# Patient Record
Sex: Male | Born: 1965 | Race: White | Hispanic: No | Marital: Married | Smoking: Never smoker
Health system: Southern US, Community
[De-identification: ages and names within clinical notes are randomized; demographics above are authoritative.]

## PROBLEM LIST (undated history)

## (undated) DIAGNOSIS — R0989 Other specified symptoms and signs involving the circulatory and respiratory systems: Secondary | ICD-10-CM

## (undated) DIAGNOSIS — T7840XA Allergy, unspecified, initial encounter: Secondary | ICD-10-CM

## (undated) DIAGNOSIS — M5412 Radiculopathy, cervical region: Secondary | ICD-10-CM

## (undated) DIAGNOSIS — K8689 Other specified diseases of pancreas: Secondary | ICD-10-CM

## (undated) DIAGNOSIS — M1990 Unspecified osteoarthritis, unspecified site: Secondary | ICD-10-CM

## (undated) DIAGNOSIS — I517 Cardiomegaly: Secondary | ICD-10-CM

## (undated) DIAGNOSIS — N529 Male erectile dysfunction, unspecified: Secondary | ICD-10-CM

## (undated) DIAGNOSIS — N183 Chronic kidney disease, stage 3 unspecified: Secondary | ICD-10-CM

## (undated) DIAGNOSIS — D751 Secondary polycythemia: Secondary | ICD-10-CM

## (undated) DIAGNOSIS — Z9189 Other specified personal risk factors, not elsewhere classified: Secondary | ICD-10-CM

## (undated) DIAGNOSIS — E559 Vitamin D deficiency, unspecified: Secondary | ICD-10-CM

## (undated) DIAGNOSIS — E785 Hyperlipidemia, unspecified: Secondary | ICD-10-CM

## (undated) DIAGNOSIS — I340 Nonrheumatic mitral (valve) insufficiency: Secondary | ICD-10-CM

## (undated) DIAGNOSIS — I10 Essential (primary) hypertension: Secondary | ICD-10-CM

## (undated) DIAGNOSIS — R7302 Impaired glucose tolerance (oral): Secondary | ICD-10-CM

## (undated) DIAGNOSIS — K760 Fatty (change of) liver, not elsewhere classified: Secondary | ICD-10-CM

## (undated) DIAGNOSIS — G43909 Migraine, unspecified, not intractable, without status migrainosus: Secondary | ICD-10-CM

## (undated) DIAGNOSIS — R161 Splenomegaly, not elsewhere classified: Secondary | ICD-10-CM

## (undated) DIAGNOSIS — K625 Hemorrhage of anus and rectum: Secondary | ICD-10-CM

## (undated) DIAGNOSIS — B019 Varicella without complication: Secondary | ICD-10-CM

## (undated) DIAGNOSIS — F32A Depression, unspecified: Secondary | ICD-10-CM

## (undated) DIAGNOSIS — F419 Anxiety disorder, unspecified: Secondary | ICD-10-CM

## (undated) DIAGNOSIS — R7303 Prediabetes: Secondary | ICD-10-CM

## (undated) HISTORY — DX: Hyperlipidemia, unspecified: E78.5

## (undated) HISTORY — DX: Secondary polycythemia: D75.1

## (undated) HISTORY — DX: Other specified symptoms and signs involving the circulatory and respiratory systems: R09.89

## (undated) HISTORY — DX: Other specified personal risk factors, not elsewhere classified: Z91.89

## (undated) HISTORY — DX: Hemorrhage of anus and rectum: K62.5

## (undated) HISTORY — DX: Migraine, unspecified, not intractable, without status migrainosus: G43.909

## (undated) HISTORY — DX: Allergy, unspecified, initial encounter: T78.40XA

## (undated) HISTORY — DX: Essential (primary) hypertension: I10

## (undated) HISTORY — PX: WISDOM TOOTH EXTRACTION: SHX21

## (undated) HISTORY — DX: Depression, unspecified: F32.A

## (undated) HISTORY — DX: Chronic kidney disease, stage 3 unspecified: N18.30

## (undated) HISTORY — DX: Vitamin D deficiency, unspecified: E55.9

## (undated) HISTORY — PX: COLONOSCOPY: SHX174

## (undated) HISTORY — DX: Varicella without complication: B01.9

## (undated) HISTORY — DX: Nonrheumatic mitral (valve) insufficiency: I34.0

## (undated) HISTORY — DX: Impaired glucose tolerance (oral): R73.02

## (undated) HISTORY — DX: Radiculopathy, cervical region: M54.12

## (undated) HISTORY — DX: Male erectile dysfunction, unspecified: N52.9

## (undated) HISTORY — DX: Other specified diseases of pancreas: K86.89

## (undated) HISTORY — DX: Splenomegaly, not elsewhere classified: R16.1

## (undated) HISTORY — DX: Fatty (change of) liver, not elsewhere classified: K76.0

## (undated) HISTORY — DX: Cardiomegaly: I51.7

---

## 2005-04-05 ENCOUNTER — Emergency Department (HOSPITAL_COMMUNITY): Admission: EM | Admit: 2005-04-05 | Discharge: 2005-04-05 | Payer: Self-pay | Admitting: Emergency Medicine

## 2007-07-30 ENCOUNTER — Emergency Department (HOSPITAL_COMMUNITY): Admission: EM | Admit: 2007-07-30 | Discharge: 2007-07-30 | Payer: Self-pay | Admitting: Emergency Medicine

## 2011-04-18 LAB — DIFFERENTIAL
Basophils Absolute: 0
Basophils Relative: 1
Eosinophils Relative: 3
Lymphs Abs: 2.6
Monocytes Relative: 8
Neutro Abs: 2.9
Neutrophils Relative %: 46

## 2011-04-18 LAB — COMPREHENSIVE METABOLIC PANEL
ALT: 33
AST: 24
BUN: 6
Chloride: 102
Creatinine, Ser: 0.98
GFR calc Af Amer: >60
GFR calc non Af Amer: >60
Glucose, Bld: 101 — ABNORMAL HIGH
Sodium: 138

## 2011-04-18 LAB — CBC
RBC: 5.19
RDW: 11.9

## 2011-04-18 LAB — POCT CARDIAC MARKERS: CKMB, poc: <1 — ABNORMAL LOW

## 2012-10-07 DIAGNOSIS — R55 Syncope and collapse: Secondary | ICD-10-CM | POA: Insufficient documentation

## 2012-10-07 DIAGNOSIS — R0789 Other chest pain: Secondary | ICD-10-CM | POA: Insufficient documentation

## 2012-10-07 DIAGNOSIS — R001 Bradycardia, unspecified: Secondary | ICD-10-CM | POA: Insufficient documentation

## 2012-10-07 DIAGNOSIS — E785 Hyperlipidemia, unspecified: Secondary | ICD-10-CM | POA: Insufficient documentation

## 2012-10-07 DIAGNOSIS — R079 Chest pain, unspecified: Secondary | ICD-10-CM

## 2012-10-07 HISTORY — DX: Chest pain, unspecified: R07.9

## 2014-03-18 LAB — TESTOSTERONE: Testosterone: 5.99

## 2014-03-18 LAB — POCT ERYTHROCYTE SEDIMENTATION RATE, NON-AUTOMATED: Sed Rate: 2

## 2016-11-15 LAB — IRON,TIBC AND FERRITIN PANEL
Ferritin: 68.3
TIBC: 615

## 2016-11-22 LAB — HM COLONOSCOPY

## 2016-12-23 LAB — HM HEPATITIS C SCREENING LAB: HM Hepatitis Screen: NEGATIVE

## 2020-01-27 DIAGNOSIS — I517 Cardiomegaly: Secondary | ICD-10-CM

## 2020-01-27 HISTORY — DX: Cardiomegaly: I51.7

## 2020-02-04 LAB — BASIC METABOLIC PANEL
BUN: 20 (ref 4–21)
CO2: 28 — AB (ref 13–22)
Chloride: 100 (ref 99–108)
Creatinine: 1.5 — AB (ref 0.6–1.3)
Glucose: 97
Potassium: 4.1 (ref 3.4–5.3)
Sodium: 137 (ref 137–147)

## 2020-02-04 LAB — COMPREHENSIVE METABOLIC PANEL
Albumin: 4.6 (ref 3.5–5.0)
Calcium: 10.5 (ref 8.7–10.7)
GFR calc Af Amer: 59
GFR calc non Af Amer: 49
Globulin: 2.3

## 2020-02-04 LAB — PULMONARY FUNCTION TEST

## 2020-02-04 LAB — LIPID PANEL
Cholesterol: 248 — AB (ref 0–200)
HDL: 38 (ref 35–70)
LDL Cholesterol: 185
Triglycerides: 126 (ref 40–160)

## 2020-02-04 LAB — CBC AND DIFFERENTIAL
HCT: 51 (ref 41–53)
Hemoglobin: 15.8 (ref 13.5–17.5)
Platelets: 277 (ref 150–399)
WBC: 6.9

## 2020-02-04 LAB — HEPATIC FUNCTION PANEL
ALT: 25 (ref 10–40)
AST: 21 (ref 14–40)
Alkaline Phosphatase: 57 (ref 25–125)
Bilirubin, Total: 0.8

## 2020-02-04 LAB — TSH: TSH: 1.24 (ref 0.41–5.90)

## 2020-02-04 LAB — HEMOGLOBIN A1C: Hemoglobin A1C: 5.2

## 2020-02-04 LAB — HM HIV SCREENING LAB: HM HIV Screening: NEGATIVE

## 2020-02-04 LAB — PSA: PSA: 1.01

## 2020-02-04 LAB — CBC: RBC: 5.12 — AB (ref 3.87–5.11)

## 2020-02-04 LAB — VITAMIN D 25 HYDROXY (VIT D DEFICIENCY, FRACTURES): Vit D, 25-Hydroxy: 34.4

## 2020-02-15 DIAGNOSIS — I517 Cardiomegaly: Secondary | ICD-10-CM | POA: Insufficient documentation

## 2020-02-15 HISTORY — DX: Cardiomegaly: I51.7

## 2020-09-19 ENCOUNTER — Other Ambulatory Visit: Payer: Self-pay

## 2020-10-04 ENCOUNTER — Other Ambulatory Visit: Payer: Self-pay

## 2020-10-16 ENCOUNTER — Encounter: Payer: Self-pay | Admitting: Family Medicine

## 2020-10-30 ENCOUNTER — Ambulatory Visit (INDEPENDENT_AMBULATORY_CARE_PROVIDER_SITE_OTHER): Payer: 59 | Admitting: Family Medicine

## 2020-10-30 ENCOUNTER — Encounter: Payer: Self-pay | Admitting: Family Medicine

## 2020-10-30 ENCOUNTER — Other Ambulatory Visit: Payer: Self-pay

## 2020-10-30 VITALS — BP 138/90 | HR 49 | Temp 97.1°F | Ht 70.25 in | Wt 223.0 lb

## 2020-10-30 DIAGNOSIS — N183 Chronic kidney disease, stage 3 unspecified: Secondary | ICD-10-CM | POA: Insufficient documentation

## 2020-10-30 DIAGNOSIS — Z0000 Encounter for general adult medical examination without abnormal findings: Secondary | ICD-10-CM

## 2020-10-30 DIAGNOSIS — I440 Atrioventricular block, first degree: Secondary | ICD-10-CM | POA: Insufficient documentation

## 2020-10-30 DIAGNOSIS — E559 Vitamin D deficiency, unspecified: Secondary | ICD-10-CM | POA: Diagnosis not present

## 2020-10-30 DIAGNOSIS — E782 Mixed hyperlipidemia: Secondary | ICD-10-CM

## 2020-10-30 DIAGNOSIS — N1831 Chronic kidney disease, stage 3a: Secondary | ICD-10-CM | POA: Diagnosis not present

## 2020-10-30 DIAGNOSIS — R55 Syncope and collapse: Secondary | ICD-10-CM

## 2020-10-30 DIAGNOSIS — Z789 Other specified health status: Secondary | ICD-10-CM | POA: Insufficient documentation

## 2020-10-30 DIAGNOSIS — R001 Bradycardia, unspecified: Secondary | ICD-10-CM

## 2020-10-30 DIAGNOSIS — I10 Essential (primary) hypertension: Secondary | ICD-10-CM | POA: Diagnosis not present

## 2020-10-30 MED ORDER — AMLODIPINE BESYLATE 2.5 MG PO TABS: 2.5000 mg | ORAL_TABLET | Freq: Every day | ORAL | 1 refills | Status: DC

## 2020-10-30 MED ORDER — EZETIMIBE 10 MG PO TABS: 10.0000 mg | ORAL_TABLET | Freq: Every day | ORAL | 3 refills | Status: DC

## 2020-10-30 NOTE — Patient Instructions (Addendum)
Nice to meet you today.  Next appt in 4 months - when BP stable on new med> we will see you ever 6 months.   We will collect fasting labs in  4 months.    Preventing High Cholesterol Cholesterol is a white, waxy substance similar to fat that the human body needs to help build cells. The liver makes all the cholesterol that a person's body needs. Having high cholesterol (hypercholesterolemia) increases your risk for heart disease and stroke. Extra or excess cholesterol comes from the food that you eat. High cholesterol can often be prevented with diet and lifestyle changes. If you already have high cholesterol, you can control it with diet, lifestyle changes, and medicines. How can high cholesterol affect me? If you have high cholesterol, fatty deposits (plaques) may build up on the walls of your blood vessels. The blood vessels that carry blood away from your heart are called arteries. Plaques make the arteries narrower and stiffer. This in turn can:  Restrict or block blood flow and cause blood clots to form.  Increase your risk for heart attack and stroke. What can increase my risk for high cholesterol? This condition is more likely to develop in people who:  Eat foods that are high in saturated fat or cholesterol. Saturated fat is mostly found in foods that come from animal sources.  Are overweight.  Are not getting enough exercise.  Have a family history of high cholesterol (familial hypercholesterolemia). What actions can I take to prevent this? Nutrition  Eat less saturated fat.  Avoid trans fats (partially hydrogenated oils). These are often found in margarine and in some baked goods, fried foods, and snacks bought in packages.  Avoid precooked or cured meat, such as bacon, sausages, or meat loaves.  Avoid foods and drinks that have added sugars.  Eat more fruits, vegetables, and whole grains.  Choose healthy sources of protein, such as fish, poultry, lean cuts of red  meat, beans, peas, lentils, and nuts.  Choose healthy sources of fat, such as: ? Nuts. ? Vegetable oils, especially olive oil. ? Fish that have healthy fats, such as omega-3 fatty acids. These fish include mackerel or salmon.   Lifestyle  Lose weight if you are overweight. Maintaining a healthy body mass index (BMI) can help prevent or control high cholesterol. It can also lower your risk for diabetes and high blood pressure. Ask your health care provider to help you with a diet and exercise plan to lose weight safely.  Do not use any products that contain nicotine or tobacco, such as cigarettes, e-cigarettes, and chewing tobacco. If you need help quitting, ask your health care provider. Alcohol use  Do not drink alcohol if: ? Your health care provider tells you not to drink. ? You are pregnant, may be pregnant, or are planning to become pregnant.  If you drink alcohol: ? Limit how much you use to:  0-1 drink a day for women.  0-2 drinks a day for men. ? Be aware of how much alcohol is in your drink. In the [place] one drink equals one 12 oz bottle of beer (355 mL), one 5 oz glass of wine (148 mL), or one 1 oz glass of hard liquor (44 mL). Activity  Get enough exercise. Do exercises as told by your health care provider.  Each week, do at least 150 minutes of exercise that takes a medium level of effort (moderate-intensity exercise). This kind of exercise: ? Makes your heart beat faster while allowing  you to still be able to talk. ? Can be done in short sessions several times a day or longer sessions a few times a week. For example, on 5 days each week, you could walk fast or ride your bike 3 times a day for 10 minutes each time.   Medicines  Your health care provider may recommend medicines to help lower cholesterol. This may be a medicine to lower the amount of cholesterol that your liver makes. You may need medicine if: ? Diet and lifestyle changes have not lowered your cholesterol  enough. ? You have high cholesterol and other risk factors for heart disease or stroke.  Take over-the-counter and prescription medicines only as told by your health care provider. General information  Manage your risk factors for high cholesterol. Talk with your health care provider about all your risk factors and how to lower your risk.  Manage other conditions that you have, such as diabetes or high blood pressure (hypertension).  Have blood tests to check your cholesterol levels at regular points in time as told by your health care provider.  Keep all follow-up visits as told by your health care provider. This is important. Where to find more information  American Heart Association: www.heart.org  National Heart, Lung, and Blood Institute: PopSteam.iswww.nhlbi.nih.gov Summary  High cholesterol increases your risk for heart disease and stroke. By keeping your cholesterol level low, you can reduce your risk for these conditions.  High cholesterol can often be prevented with diet and lifestyle changes.  Work with your health care provider to manage your risk factors, and have your blood tested regularly. This information is not intended to replace advice given to you by your health care provider. Make sure you discuss any questions you have with your health care provider. Document Revised: 04/27/2019 Document Reviewed: 04/27/2019 Elsevier Patient Education  2021 Elsevier Inc.    Please help us help you:  We are [facility]. Below you will find basic instructions that you may need to access in the future. Please help us help you by reading the instructions, which cover many of the frequent questions we experience.   Prescription refills and request:  -In order to allow more efficient response time, please call your pharmacy for all refills. They will forward the request electronically to us. This allows for the quickest possible  response. Request left on a nurse line can take longer to refill, since these are checked as time allows between office patients and other phone calls.  - refill request can take up to 3-5 working days to complete.  - If request is sent electronically and request is appropiate, it is usually completed in 1-2 business days.  - all patients will need to be seen routinely for all chronic medical conditions requiring prescription medications (see follow-up below). If you are overdue for follow up on your condition, you will be asked to make an appointment and we will call in enough medication to cover you until your appointment (up to 30 days).  - all controlled substances will require a face to face visit to request/refill.  - if you desire your prescriptions to go through a new pharmacy, and have an active script at original pharmacy, you will need to call your pharmacy and have scripts transferred to new pharmacy. This is completed between the pharmacy locations and not by your provider.    Results: Our office handles many outgoing and incoming calls daily.  If we have not contacted you within 1 week about your results, please check your mychart to see if there is a message first and if not, then contact our office.  In helping with this matter, you help decrease call volume, and therefore allow [place] to be able to respond to patients needs more efficiently.  We will always attempt to call you with results,  normal or abnormal. However, if we are unable to reach you we will send a message in your my chart with results.   Acute office visits (sick visit):  An acute visit is intended for a new problem and are scheduled in shorter time slots to allow schedule openings for patients with new problems. This is the appropriate visit to discuss a new problem. Problems will not be addressed by phone call or Echart message. Appointment is needed if requesting treatment. In order to provide you with excellent quality  medical care with proper time for you to explain your problem, have an exam and receive treatment with instructions, these appointments should be limited to one new problem per visit. If you experience a new problem, in which you desire to be addressed, please make an acute office visit, we save openings on the schedule to accommodate you. Please do not save your new problem for any other type of visit, let us take care of it properly and quickly for you.   Follow up visits:  Depending on your condition(s) your provider will need to see you routinely in order to provide you with quality care and prescribe medication(s). Most chronic conditions (Example: hypertension, Diabetes, depression/anxiety... etc), require visits a couple times a year. Your provider will instruct you on proper follow up for your personal medical conditions and history. Please make certain to make follow up appointments for your condition as instructed. Failing to do so could result in lapse in your medication treatment/refills. If you request a refill, and are overdue to be seen on a condition, we will always provide you with a 30 day script (once) to allow you time to schedule.    Medicare wellness (well visit): - we have a wonderful Nurse Selena Batten), that will meet with you and provide you will yearly medicare wellness visits. These visits should occur yearly (can not be scheduled less than 1 calendar year apart) and cover preventive health, immunizations, advance directives and screenings you are entitled to yearly through your medicare benefits. Do not miss out on your entitled benefits, this is when medicare will pay for these benefits to be ordered for you.  These are strongly encouraged by your provider and is the appropriate type of visit to make certain you are up to date with all preventive health benefits. If you have not had your medicare wellness exam in the last 12 months, please make certain to schedule one by calling the  office and schedule your medicare wellness with Selena Batten as soon as possible.   Yearly physical (well visit):  - Adults are recommended to be seen yearly for physicals. Check with your insurance and date of your last physical, most insurances require one calendar year between physicals. Physicals include all preventive health topics, screenings, medical exam and labs that are appropriate for gender/age and history. You may have fasting labs needed at this visit. This is a well visit (not a sick visit), new problems should not be covered during this visit (see acute visit).  - Pediatric patients are seen more frequently when they are younger. Your provider will  advise you on well child visit timing that is appropriate for your their age. - This is not a medicare wellness visit. Medicare wellness exams do not have an exam portion to the visit. Some medicare companies allow for a physical, some do not allow a yearly physical. If your medicare allows a yearly physical you can schedule the medicare wellness with our nurse Selena Batten and have your physical with your provider after, on the same day. Please check with insurance for your full benefits.   Late Policy/No Shows:  - all new patients should arrive 15-30 minutes earlier than appointment to allow [place] time  to  obtain all personal demographics,  insurance information and for you to complete office paperwork. - All established patients should arrive 10-15 minutes earlier than appointment time to update all information and be checked in .  - In our best efforts to run on time, if you are late for your appointment you will be asked to either reschedule or if able, we will work you back into the schedule. There will be a wait time to work you back in the schedule,  depending on availability.  - If you are unable to make it to your appointment as scheduled, please call 24 hours ahead of time to allow [place] to fill the time slot with someone else who needs to be seen. If you  do not cancel your appointment ahead of time, you may be charged a no show fee.

## 2020-10-30 NOTE — Progress Notes (Signed)
Patient ID: Samuel Blackburn, male  DOB: [birth year], 55 y.o.   MRN: [id number] Patient Care Team    Relationship Specialty Notifications Start End  Natalia Leatherwood, DO PCP - General Family Medicine  10/30/20     Chief Complaint  Patient presents with  . Establish Care    [facility];     Subjective:  Samuel Blackburn is a 55 y.o.  male present for new patient establishment. All past medical history, surgical history, allergies, family history, immunizations, medications and social history were updated in the electronic medical record today. All recent labs, ED visits and hospitalizations within the last year were reviewed.   Hypertension/hyperlipidemia/overweight:Pt reports compliance with HCTZ 25 mg daily. Blood pressures ranges at home have been above goal. Patient denies chest pain, shortness of breath or lower extremity edema. Pt takes a daily baby ASA. Pt was prescribed statin, Crestor 10 mg nightly, however he states that the medicine caused memory issues. Exercise: Has started to get back in the gym. RF: HTN, HLD,Body mass index is 31.77 kg/m.,  Family history HD and stroke  CKD 3: Patient has mild decreased kidney function by last labs in July with a creatinine of 1.5 and a GFR of 49.  Vitamin D deficiency: History of vitamin D deficiency taking supplement.  Vasovagal/presyncope/bradycardia: Patient reports he has a past medical history of fainting spells.  He states he passes out typically with procedures or blood draw.  He reports he had a full work-up in the late 90s for this condition and no cause was found.  He reports he has followed with cardiology and wore a monitor for 30 days, and nothing was reported as cause.  He has a few other family members that also has vasovagal episodes frequently.  He has chronic bradycardia.  Depression screen [facility] 2/9 10/30/2020  Decreased Interest 0  Down, Depressed, Hopeless 0  PHQ - 2 Score 0   No flowsheet data found.      Fall Risk   10/30/2020  Falls in the past year? 0  Number falls in past yr: 0  Injury with Fall? 0  Follow up Falls evaluation completed     Immunization History  Administered Date(s) Administered  . Influenza-Unspecified 05/27/2020  . PFIZER(Purple Top)SARS-COV-2 Vaccination 10/18/2019, 11/15/2019, 06/29/2020    No exam data present  Past Medical History:  Diagnosis Date  . Allergies   . Bruit   . Chest pain 10/07/2012  . Chicken pox   . Depression   . Hepatic steatosis   . History of fainting spells of unknown cause   . Hyperlipemia   . Hypertension   . IGT (impaired glucose tolerance)   . LAE (left atrial enlargement)   . LVH (left ventricular hypertrophy)   . Migraines   . Mitral regurgitation   . Polycythemia   . Stage 3 chronic kidney disease (HCC)   . Vitamin D deficiency    Allergies  Allergen Reactions  . Dust Mite Extract     Pollen, dust, and wire   Past Surgical History:  Procedure Laterality Date  . COLONOSCOPY  20118   Family History  Problem Relation Age of Onset  . Stroke Mother   . Early death Mother   . Depression Mother   . Anxiety disorder Mother   . Diabetes Father   . Heart disease Father   . Hyperlipidemia Father   . Colon cancer Father   . Depression Maternal Grandmother   . Miscarriages / [place]  Maternal Grandmother   . Hyperlipidemia Maternal Grandfather   . Hypertension Maternal Grandfather   . Diabetes Paternal Grandmother   . COPD Paternal Grandfather   . Hearing loss Paternal Grandfather   . Heart disease Paternal Grandfather   . Healthy Brother   . Healthy Daughter   . Healthy Son    Social History   Social History Narrative   Marital status/children/pets: Married   Education/employment: Oncologist, works as an Manufacturing systems engineer.   Safety:      -smoke alarm in the home:Yes     - wears seatbelt: Yes    Allergies as of 10/30/2020      Reactions   Dust Mite Extract    Pollen, dust, and wire      Medication  List       Accurate as of October 30, 2020 12:42 PM. If you have any questions, ask your nurse or doctor.        STOP taking these medications   hydrochlorothiazide 25 MG tablet Commonly known as: HYDRODIURIL Stopped by: Felix Pacini, DO   rosuvastatin 10 MG tablet Commonly known as: CRESTOR Stopped by: Felix Pacini, DO     TAKE these medications   amLODipine 2.5 MG tablet Commonly known as: NORVASC Take 1 tablet (2.5 mg total) by mouth daily. Started by: Felix Pacini, DO   aspirin EC 81 MG tablet Take 1 tablet (81 mg total) by mouth daily. Swallow whole.   Cholecalciferol 25 MCG (1000 UT) capsule 10 capsules every 3 days   Co Q-10 200 MG Caps Take by mouth.   ezetimibe 10 MG tablet Commonly known as: Zetia Take 1 tablet (10 mg total) by mouth daily. Started by: Felix Pacini, DO   Fish Oil 645 MG Caps Take by mouth.   Flaxseed Oil 1400 MG Caps Take by mouth.   Grape Seed Extract 100 MG Caps Take 5 capsules (500 mg total) by mouth.   Magnesium 200 MG Tabs Take 2 tablets (400 mg total) by mouth.   melatonin 5 MG Tabs Take 3 tablets (15 mg total) by mouth.   multivitamin tablet Take 1 tablet by mouth daily.   Neoke BCAA4 Powd Take 1 g by mouth.       All past medical history, surgical history, allergies, family history, immunizations andmedications were updated in the EMR today and reviewed under the history and medication portions of their EMR.    No results found for this or any previous visit (from the past 2160 hour(s)).   ROS: 14 pt review of systems performed and negative (unless mentioned in an HPI)  Objective: BP 138/90 (BP Location: Right Arm, Patient Position: Sitting, Cuff Size: Large)   Pulse (!) 49   Temp (!) 97.1 F (36.2 C) (Temporal)   Ht 5' 10.25" (1.784 m)   Wt 223 lb (101.2 kg)   SpO2 98%   BMI 31.77 kg/m  Gen: Afebrile. No acute distress. Nontoxic in appearance, well-developed, well-nourished,  Pleasant male.  HENT: AT. [place].   Eyes:Pupils Equal Round Reactive to light, Extraocular movements intact,  Conjunctiva without redness, discharge or icterus. Neck/lymp/endocrine: Supple,no lymphadenopathy, no thyromegaly CV: RRR no murmur, no edema, +2/4 P posterior tibialis pulses.  Chest: CTAB, no wheeze, rhonchi or crackles. normal Respiratory effort. good Air movement. Skin: no rashes, purpura or petechiae. Warm and well-perfused. Skin intact. Neuro/Msk: Normal gait. PERLA. EOMi. Alert. Oriented x3.   Psych: Normal affect, dress and demeanor. Normal speech. Normal thought content and judgment.  Assessment/plan: Samuel Blackburn  Samuel Blackburn is a 55 y.o. male present for est care.  Encounter for medical examination to establish care Hypertension/mixed hyperlipidemia/obesity/statin intolerance DC HCTZ Start amlodipine 2.5 mg daily> we will taper to 5 mg next visit if needed. CBC, CMP, TSH and lipids collected next visit. Zetia 10 mg nightly (patient to start Zetia in 2 weeks after starting amlodipine) Intolerant to statin-Crestor Low-sodium diet, heart healthy diet Routine exercise Follow-up in 4 months  Vitamin D deficiency Stable Continue vitamin D OTC supplement 1000 units daily Monitor vitamin D yearly   Stage 3a chronic kidney disease (HCC) CMP will be collected next visit. Avoid nephrotoxic agents when feasible. Hydrate We will monitor every 6-12 months  Bradycardia/vasovagal Patient has a history of bradycardia and possible first-degree AV block by history.  He reports this has been chronic his entire life.  He states he has had cardiology evaluation and even a hospital stay that did not resolve with cause of his symptoms.   Return in about 4 months (around 03/01/2021) for [facility] (30 min).  No orders of the defined types were placed in this encounter.  Meds ordered this encounter  Medications  . ezetimibe (ZETIA) 10 MG tablet    Sig: Take 1 tablet (10 mg total) by mouth daily.    Dispense:  90 tablet    Refill:   3  . amLODipine (NORVASC) 2.5 MG tablet    Sig: Take 1 tablet (2.5 mg total) by mouth daily.    Dispense:  90 tablet    Refill:  1   Referral Orders  No referral(s) requested today    > 45 Minutes was dedicated to this patient's encounter to include pre-visit review of chart, face-to-face time with patient and post-visit work- which include documentation and prescribing medications and/or ordering test when necessary.    Note is dictated utilizing voice recognition software. Although note has been proof read prior to signing, occasional typographical errors still can be missed. If any questions arise, please do not hesitate to call for verification.  Electronically signed by: Felix Pacini, DO [facility] [place]

## 2020-12-08 ENCOUNTER — Other Ambulatory Visit: Payer: Self-pay

## 2020-12-08 ENCOUNTER — Encounter: Payer: Self-pay | Admitting: Family Medicine

## 2020-12-08 ENCOUNTER — Ambulatory Visit (INDEPENDENT_AMBULATORY_CARE_PROVIDER_SITE_OTHER): Payer: 59 | Admitting: Family Medicine

## 2020-12-08 VITALS — BP 136/90 | HR 54 | Temp 97.5°F | Ht 70.0 in | Wt 228.0 lb

## 2020-12-08 DIAGNOSIS — I440 Atrioventricular block, first degree: Secondary | ICD-10-CM | POA: Diagnosis not present

## 2020-12-08 DIAGNOSIS — Z8249 Family history of ischemic heart disease and other diseases of the circulatory system: Secondary | ICD-10-CM

## 2020-12-08 DIAGNOSIS — I10 Essential (primary) hypertension: Secondary | ICD-10-CM

## 2020-12-08 DIAGNOSIS — R0789 Other chest pain: Secondary | ICD-10-CM | POA: Diagnosis not present

## 2020-12-08 DIAGNOSIS — R0600 Dyspnea, unspecified: Secondary | ICD-10-CM

## 2020-12-08 DIAGNOSIS — E782 Mixed hyperlipidemia: Secondary | ICD-10-CM | POA: Diagnosis not present

## 2020-12-08 LAB — COMPREHENSIVE METABOLIC PANEL
ALT: 33 U/L (ref 0–53)
AST: 21 U/L (ref 0–37)
Albumin: 4.7 g/dL (ref 3.5–5.2)
Alkaline Phosphatase: 68 U/L (ref 39–117)
BUN: 19 mg/dL (ref 6–23)
CO2: 30 mEq/L (ref 19–32)
Calcium: 9.7 mg/dL (ref 8.4–10.5)
Chloride: 102 mEq/L (ref 96–112)
Creatinine, Ser: 1.26 mg/dL (ref 0.40–1.50)
GFR: 64.79 mL/min (ref >60.00)
Glucose, Bld: 104 mg/dL — ABNORMAL HIGH (ref 70–99)
Potassium: 4.6 mEq/L (ref 3.5–5.1)
Sodium: 139 mEq/L (ref 135–145)
Total Bilirubin: 0.8 mg/dL (ref 0.2–1.2)
Total Protein: 7.1 g/dL (ref 6.0–8.3)

## 2020-12-08 LAB — CBC WITH DIFFERENTIAL/PLATELET
Basophils Absolute: 0 10*3/uL (ref 0.0–0.1)
Basophils Relative: 0.7 % (ref 0.0–3.0)
Eosinophils Absolute: 0.3 10*3/uL (ref 0.0–0.7)
Eosinophils Relative: 4.9 % (ref 0.0–5.0)
HCT: 48.1 % (ref 39.0–52.0)
Hemoglobin: 16.1 g/dL (ref 13.0–17.0)
Lymphocytes Relative: 43.9 % (ref 12.0–46.0)
Lymphs Abs: 2.3 10*3/uL (ref 0.7–4.0)
MCHC: 33.5 g/dL (ref 30.0–36.0)
MCV: 92.1 fl (ref 78.0–100.0)
Monocytes Absolute: 0.4 10*3/uL (ref 0.1–1.0)
Monocytes Relative: 7.3 % (ref 3.0–12.0)
Neutro Abs: 2.2 10*3/uL (ref 1.4–7.7)
Neutrophils Relative %: 43.2 % (ref 43.0–77.0)
Platelets: 233 10*3/uL (ref 150.0–400.0)
RBC: 5.22 Mil/uL (ref 4.22–5.81)
RDW: 13 % (ref 11.5–15.5)
WBC: 5.2 10*3/uL (ref 4.0–10.5)

## 2020-12-08 LAB — TSH: TSH: 1.97 u[IU]/mL (ref 0.35–4.50)

## 2020-12-08 MED ORDER — LISINOPRIL 2.5 MG PO TABS: 2.5000 mg | ORAL_TABLET | Freq: Every day | ORAL | 5 refills | Status: DC

## 2020-12-08 NOTE — Progress Notes (Signed)
This visit occurred during the SARS-CoV-2 public health emergency.  Safety protocols were in place, including screening questions prior to the visit, additional usage of staff PPE, and extensive cleaning of exam room while observing appropriate contact time as indicated for disinfecting solutions.    Samuel Blackburn , 01-May-1966, 55 y.o., male MRN: [id number] Patient Care Team    Relationship Specialty Notifications Start End  Ma Hillock, DO PCP - General Family Medicine  10/30/20     Chief Complaint  Patient presents with  . Fatigue    Pt was seen at [facility] for fatigue, Chest tightness, and SOB; pt states sx are now resolved      Subjective: Pt presents for an OV for Urgent care follow up for chest pain.  He has a significant medical history  Of htn/hld and is not taking his medications of amlodipine, hctz or zetia. He has fhx of MI/stroke with sudden death. He was seen in the urgent care for fatigue, chest tightness and shortness of breath.  The completed a COVID test which was negative and an EKG which was read as normal. Reports his symptoms are mostly resolved now.  He does notice he still getting "breathy "with physical activity such as walking the dog and a little winded even when talking.  He had an episode a few weeks ago when he was at the gym that he became extremely fatigued after working out.  He had needed to take a nap for about 4 hours.  Since that time he has continued to have symptoms up until most recently when he was seen at the urgent care.  His blood pressure has continued to be mildly above goal. He has had a heart cath in 2006.  CT stress echo in 2014 and 2017. He denies any fever, cough, no recent illness, blood loss.  He reports he tolerates p.o. and is drinking plenty of water daily.  Depression screen [facility] 2/9 10/30/2020  Decreased Interest 0  Down, Depressed, Hopeless 0  PHQ - 2 Score 0    Allergies  Allergen Reactions  . Dust Mite Extract     Pollen,  dust, and wire   Social History   Social History Narrative   Marital status/children/pets: Married   Education/employment: Dietitian, works as an Pensions consultant.   Safety:      -smoke alarm in the home:Yes     - wears seatbelt: Yes   Past Medical History:  Diagnosis Date  . Allergies   . Bruit   . Cervical radiculopathy   . Chest pain 10/07/2012  . Chicken pox   . Depression   . Erectile dysfunction   . Fatty pancreas   . Hepatic steatosis   . History of fainting spells of unknown cause   . Hyperlipemia   . Hypertension   . IGT (impaired glucose tolerance)   . LAE (left atrial enlargement) 01/2020   "Markedly dilated "  . LVH (left ventricular hypertrophy) 02/15/2020   Mild concentric LVH  . Migraines   . Mitral regurgitation   . Polycythemia    Had hematology work-up and condition is benign  . Rectal bleeding   . Splenomegaly    "Mild "  . Stage 3 chronic kidney disease ([place])   . Vitamin D deficiency    Past Surgical History:  Procedure Laterality Date  . COLONOSCOPY  20118   Family History  Problem Relation Age of Onset  . Stroke Mother   . Early  death Mother   . Depression Mother   . Anxiety disorder Mother   . Diabetes Father   . Heart disease Father   . Hyperlipidemia Father   . Colon cancer Father   . Depression Maternal Grandmother   . Miscarriages / Stillbirths Maternal Grandmother   . Hyperlipidemia Maternal Grandfather   . Hypertension Maternal Grandfather   . Diabetes Paternal Grandmother   . COPD Paternal Grandfather   . Hearing loss Paternal Grandfather   . Heart disease Paternal Grandfather   . Healthy Brother   . Healthy Daughter   . Healthy Son    Allergies as of 12/08/2020      Reactions   Dust Mite Extract    Pollen, dust, and wire      Medication List       Accurate as of Dec 08, 2020  9:40 AM. If you have any questions, ask your nurse or doctor.        STOP taking these medications   amLODipine 2.5 MG  tablet Commonly known as: NORVASC Stopped by: Howard Pouch, DO   ezetimibe 10 MG tablet Commonly known as: Zetia Stopped by: Howard Pouch, DO   Flaxseed Oil 1400 MG Caps Stopped by: Howard Pouch, DO   Magnesium 200 MG Tabs Stopped by: Howard Pouch, DO   melatonin 5 MG Tabs Stopped by: Howard Pouch, DO     TAKE these medications   aspirin EC 81 MG tablet Take 162 mg by mouth daily. Swallow whole.   Cholecalciferol 25 MCG (1000 UT) capsule 10 capsules every 3 days   Co Q-10 200 MG Caps Take by mouth.   Fish Oil 645 MG Caps Take by mouth.   Grape Seed Extract 100 MG Caps Take 5 capsules (500 mg total) by mouth.   hydrochlorothiazide 25 MG tablet Commonly known as: HYDRODIURIL Take 1 tablet by mouth daily.   lisinopril 2.5 MG tablet Commonly known as: Zestril Take 1 tablet (2.5 mg total) by mouth daily. Started by: Howard Pouch, DO   multivitamin tablet Take 1 tablet by mouth daily.   Neoke BCAA4 Powd Take 1 g by mouth.       All past medical history, surgical history, allergies, family history, immunizations andmedications were updated in the EMR today and reviewed under the history and medication portions of their EMR.     ROS: Negative, with the exception of above mentioned in HPI   Objective:  BP 136/90   Pulse (!) 54   Temp (!) 97.5 F (36.4 C) (Oral)   Ht _0  (1.778 m)   Wt 228 lb (103.4 kg)   SpO2 98%   BMI 32.71 kg/m  Body mass index is 32.71 kg/m. Gen: Afebrile. No acute distress. Nontoxic in appearance, well developed, well nourished.  HENT: AT. [place]. MMM, no oral lesions.  No cough.  No hoarseness Eyes:Pupils Equal Round Reactive to light, Extraocular movements intact,  Conjunctiva without redness, discharge or icterus. Neck/lymp/endocrine: Supple, no lymphadenopathy CV: RRR no murmur, no edema Chest: CTAB, no wheeze or crackles. Good air movement, normal resp effort.  Skin: No rashes, purpura or petechiae.  Neuro:  Normal gait.  PERLA. EOMi. Alert. Oriented x3  Psych: Normal affect, dress and demeanor. Normal speech. Normal thought content and judgment.  No exam data present No results found. No results found for this or any previous visit (from the past 24 hour(s)).  Assessment/Plan: KENYA SHIRAISHI is a 55 y.o. male present for OV for  Hypertension/mixed hyperlipidemia/first-degree AV  block/chest discomfort/dyspnea/family history of heart disease Uncertain etiology of his symptoms.  He has had extensive work-up in the past from a cardiological standpoint.  He does have a family history of heart disease.  Uncertain if his symptoms were related to his heart versus lungs.  He could have had a mild upper respiratory illness at the time. Labs today to rule out electrolyte disturbance, iron deficiency, thyroid disorder, anemia as potential causes. Patient would like to proceed with CT cardiac scoring-self-pay.  If CT cardiac score is abnormal, would then recommend referral to cardiology for further evaluation. - Comp Met (CMET) - Iron, TIBC and Ferritin Panel - TSH - CBC w/Diff - CT CARDIAC SCORING (SELF PAY ONLY); Future In the meantime, better blood pressure control is warranted. Start lisinopril 2.5 mg daily. DC HCTZ.  DC amlodipine. Consider chest x-ray if labs are normal and symptoms continue.   Reviewed expectations re: course of current medical issues.  Discussed self-management of symptoms.  Outlined signs and symptoms indicating need for more acute intervention.  Patient verbalized understanding and all questions were answered.  Patient received an After-Visit Summary.    Orders Placed This Encounter  Procedures  . Comp Met (CMET)  . Iron, TIBC and Ferritin Panel  . TSH  . CBC w/Diff   Meds ordered this encounter  Medications  . lisinopril (ZESTRIL) 2.5 MG tablet    Sig: Take 1 tablet (2.5 mg total) by mouth daily.    Dispense:  30 tablet    Refill:  5   Referral Orders  No referral(s)  requested today     Note is dictated utilizing voice recognition software. Although note has been proof read prior to signing, occasional typographical errors still can be missed. If any questions arise, please do not hesitate to call for verification.   electronically signed by:  Howard Pouch, DO  [place]

## 2020-12-08 NOTE — Patient Instructions (Addendum)
Start lisinopril 2.5 mg daily.    Great to see you today.   refilled the medication(s) we provide. If labs were collected, we will inform you of lab results once received either by echart message or telephone call.   - echart message- for normal results that have been seen by the patient already.   - telephone call: abnormal results or if patient has not viewed results in their echart.

## 2020-12-09 LAB — IRON,TIBC AND FERRITIN PANEL
%SAT: 28 % (calc) (ref 20–48)
Ferritin: 57 ng/mL (ref 38–380)
Iron: 109 ug/dL (ref 50–180)
TIBC: 394 mcg/dL (calc) (ref 250–425)

## 2020-12-11 DIAGNOSIS — Z8249 Family history of ischemic heart disease and other diseases of the circulatory system: Secondary | ICD-10-CM | POA: Insufficient documentation

## 2020-12-15 ENCOUNTER — Ambulatory Visit (HOSPITAL_BASED_OUTPATIENT_CLINIC_OR_DEPARTMENT_OTHER)
Admission: RE | Admit: 2020-12-15 | Discharge: 2020-12-15 | Disposition: A | Discharge status: Home / Self Care | Payer: 59 | Source: Ambulatory Visit | Attending: Family Medicine | Admitting: Family Medicine

## 2020-12-15 ENCOUNTER — Telehealth: Payer: Self-pay | Admitting: Family Medicine

## 2020-12-15 ENCOUNTER — Other Ambulatory Visit: Payer: Self-pay

## 2020-12-15 DIAGNOSIS — I440 Atrioventricular block, first degree: Secondary | ICD-10-CM | POA: Insufficient documentation

## 2020-12-15 DIAGNOSIS — R0789 Other chest pain: Secondary | ICD-10-CM | POA: Insufficient documentation

## 2020-12-15 DIAGNOSIS — E782 Mixed hyperlipidemia: Secondary | ICD-10-CM | POA: Insufficient documentation

## 2020-12-15 DIAGNOSIS — R0600 Dyspnea, unspecified: Secondary | ICD-10-CM | POA: Insufficient documentation

## 2020-12-15 DIAGNOSIS — R931 Abnormal findings on diagnostic imaging of heart and coronary circulation: Secondary | ICD-10-CM

## 2020-12-15 DIAGNOSIS — Z8249 Family history of ischemic heart disease and other diseases of the circulatory system: Secondary | ICD-10-CM | POA: Insufficient documentation

## 2020-12-15 DIAGNOSIS — I10 Essential (primary) hypertension: Secondary | ICD-10-CM | POA: Insufficient documentation

## 2020-12-15 NOTE — Telephone Encounter (Signed)
Please inform patient the following information: His cardiac CT result was elevated, but only in 1 artery surrounding the heart called the circumflex, which branches off the left coronary artery.  This study does show changes beginning in  at least 1 of the 4 main arteries of the heart.  Given his age, blood pressure mildly increasing, elevated cholesterol and the beginning of changes of the arteries of the heart he should be on a cholesterol medication with an LDL goal <100.   Since he was feeling winded with exercise, I do feel a cardiology referral is warranted with the above findings to be thorough.   He also reported memory issues to his statin use, Crestor, in the past.  If he would like to go ahead and try to start a different statin medication I can call that in for him or we can wait until he meets with the cardiologist to discuss. Is advise of his decision

## 2020-12-18 MED ORDER — ATORVASTATIN CALCIUM 40 MG PO TABS: 40.0000 mg | ORAL_TABLET | Freq: Every day | ORAL | 3 refills | Status: DC

## 2020-12-18 NOTE — Telephone Encounter (Signed)
Spoke with pt regarding labs and instructions. Pt agrees with referral and starting medication.

## 2020-12-18 NOTE — Telephone Encounter (Signed)
Lipitor prescribed for patient. Cardiology referral has been placed Follow-up in 3 months with this provider for recheck hyperlipidemia.  Please come fasting to that appointment we will be collecting labs.  [place]

## 2020-12-18 NOTE — Addendum Note (Signed)
Addended by: Felix Pacini A on: 12/18/2020 04:15 PM   Modules accepted: Orders

## 2020-12-19 NOTE — Telephone Encounter (Signed)
Spoke with pt regarding labs and instructions.   

## 2021-01-11 ENCOUNTER — Ambulatory Visit (INDEPENDENT_AMBULATORY_CARE_PROVIDER_SITE_OTHER): Payer: 59 | Admitting: Family Medicine

## 2021-01-11 ENCOUNTER — Other Ambulatory Visit: Payer: Self-pay

## 2021-01-11 ENCOUNTER — Encounter: Payer: Self-pay | Admitting: Family Medicine

## 2021-01-11 VITALS — BP 124/82 | HR 56 | Temp 97.7°F | Ht 70.0 in | Wt 229.0 lb

## 2021-01-11 DIAGNOSIS — E782 Mixed hyperlipidemia: Secondary | ICD-10-CM | POA: Diagnosis not present

## 2021-01-11 DIAGNOSIS — I440 Atrioventricular block, first degree: Secondary | ICD-10-CM

## 2021-01-11 DIAGNOSIS — I10 Essential (primary) hypertension: Secondary | ICD-10-CM

## 2021-01-11 DIAGNOSIS — Z789 Other specified health status: Secondary | ICD-10-CM | POA: Diagnosis not present

## 2021-01-11 DIAGNOSIS — Z8249 Family history of ischemic heart disease and other diseases of the circulatory system: Secondary | ICD-10-CM

## 2021-01-11 DIAGNOSIS — I517 Cardiomegaly: Secondary | ICD-10-CM

## 2021-01-11 NOTE — Progress Notes (Signed)
This visit occurred during the SARS-CoV-2 public health emergency.  Safety protocols were in place, including screening questions prior to the visit, additional usage of staff PPE, and extensive cleaning of exam room while observing appropriate contact time as indicated for disinfecting solutions.    Samuel Blackburn , 1966-05-05, 55 y.o., male MRN: [id number] Patient Care Team    Relationship Specialty Notifications Start End  Ma Hillock, DO PCP - General Family Medicine  10/30/20     Chief Complaint  Patient presents with   Hypertension    Pt is fasting; pt c/o Chest tightness and SOB x 3 weeks;      Subjective: the patient is a 55 y.o. male present for follow-up on hypertension and chest discomfort. Patient reports he has started lisinopril 2.5 mg daily and the atorvastatin 40 mg daily after her last appointment.  Cardiac CT did result with mild calcifications along the circumflex coronary artery.  He does have an extensive family history of cardiac disease.  He reports he still has a feeling of decreased stamina and chest discomfort.  He seems to think it may be secondary to the atorvastatin or the lisinopril, however the symptoms he is reporting are the same as when he went to urgent care prior to starting these 2 meds.  He reports feeling "winded "and decreased "stamina "and decreased exercise tolerance.  He is still running at least a half a mile a day up until Monday without overt chest pain-but "tightness "continues with activity.  He has his establish appointment with cardiology next week. He has had rather extensive cardiac and pulmonary work-up in the past at [facility] without obvious cause of symptoms which seem to be intermittent over the years.  These records are scanned into his chart.   Prior note: Pt presents for an OV for Urgent care follow up for chest pain.  He has a significant medical history  Of htn/hld and is not taking his medications of amlodipine, hctz  or zetia. He has fhx of MI/stroke with sudden death. He was seen in the urgent care for fatigue, chest tightness and shortness of breath.  The completed a COVID test which was negative and an EKG which was read as normal. Reports his symptoms are mostly resolved now.  He does notice he still getting "breathy "with physical activity such as walking the dog and a little winded even when talking.  He had an episode a few weeks ago when he was at the gym that he became extremely fatigued after working out.  He had needed to take a nap for about 4 hours.  Since that time he has continued to have symptoms up until most recently when he was seen at the urgent care.  His blood pressure has continued to be mildly above goal. He has had a heart cath in 2006.  CT stress echo in 2014 and 2017. He denies any fever, cough, no recent illness, blood loss.  He reports he tolerates p.o. and is drinking plenty of water daily.  Depression screen PHQ 2/9 10/30/2020  Decreased Interest 0  Down, Depressed, Hopeless 0  PHQ - 2 Score 0    Allergies  Allergen Reactions   Dust Mite Extract     Pollen, dust, and wire   Social History   Social History Narrative   Marital status/children/pets: Married   Education/employment: Dietitian, works as an Pensions consultant.   Safety:      -smoke alarm in the  home:Yes     - wears seatbelt: Yes   Past Medical History:  Diagnosis Date   Allergies    Bruit    Cervical radiculopathy    Chest pain 10/07/2012   Chicken pox    Depression    Erectile dysfunction    Fatty pancreas    Hepatic steatosis    History of fainting spells of unknown cause    Hyperlipemia    Hypertension    IGT (impaired glucose tolerance)    LAE (left atrial enlargement) 01/2020   "Markedly dilated "   LVH (left ventricular hypertrophy) 02/15/2020   Mild concentric LVH   Migraines    Mitral regurgitation    Polycythemia    Had hematology work-up and condition is benign   Rectal  bleeding    Splenomegaly    "Mild "   Stage 3 chronic kidney disease ([place])    Vitamin D deficiency    Past Surgical History:  Procedure Laterality Date   COLONOSCOPY  20118   Family History  Problem Relation Age of Onset   Stroke Mother    Early death Mother    Depression Mother    Anxiety disorder Mother    Diabetes Father    Heart disease Father    Hyperlipidemia Father    Colon cancer Father    Depression Maternal Grandmother    Miscarriages / Stillbirths Maternal Grandmother    Hyperlipidemia Maternal Grandfather    Hypertension Maternal Grandfather    Diabetes Paternal Grandmother    COPD Paternal Grandfather    Hearing loss Paternal Grandfather    Heart disease Paternal Grandfather    Healthy Brother    Healthy Daughter    Healthy Son    Allergies as of 01/11/2021       Reactions   Dust Mite Extract    Pollen, dust, and wire        Medication List        Accurate as of January 11, 2021 12:53 PM. If you have any questions, ask your nurse or doctor.          aspirin EC 81 MG tablet Take 162 mg by mouth daily. Swallow whole.   atorvastatin 40 MG tablet Commonly known as: LIPITOR Take 1 tablet (40 mg total) by mouth at bedtime.   Cholecalciferol 25 MCG (1000 UT) capsule 10 capsules every 3 days   Co Q-10 200 MG Caps Take by mouth.   Fish Oil 645 MG Caps Take by mouth.   Grape Seed Extract 100 MG Caps Take 5 capsules (500 mg total) by mouth.   hydrochlorothiazide 25 MG tablet Commonly known as: HYDRODIURIL Take 1 tablet by mouth daily.   lisinopril 2.5 MG tablet Commonly known as: Zestril Take 1 tablet (2.5 mg total) by mouth daily.   multivitamin tablet Take 1 tablet by mouth daily.   Neoke BCAA4 Powd Take 1 g by mouth.        All past medical history, surgical history, allergies, family history, immunizations andmedications were updated in the EMR today and reviewed under the history and medication portions of their EMR.      ROS: Negative, with the exception of above mentioned in HPI   Objective:  BP 124/82   Pulse (!) 56   Temp 97.7 F (36.5 C) (Oral)   Ht 5' 10"  (1.778 m)   Wt 229 lb (103.9 kg)   SpO2 96%   BMI 32.86 kg/m  Body mass index is 32.86 kg/m. Gen: Afebrile.  No acute distress.  HENT: AT. [place]. No cough, no hoarseness.  Eyes:Pupils Equal Round Reactive to light, Extraocular movements intact,  Conjunctiva without redness, discharge or icterus. Neck/lymp/endocrine: Supple,no lymphadenopathy, no thyromegaly CV: RRR no murmur, no edema, +2/4 P posterior tibialis pulses Chest: CTAB, no wheeze or crackles Skin: no rashes, purpura or petechiae.  Neuro: Normal gait. PERLA. EOMi. Alert. Oriented x3 Psych: Normal affect, dress and demeanor. Normal speech. Normal thought content and judgment.   No results found. No results found. No results found for this or any previous visit (from the past 24 hour(s)).  Assessment/Plan: MAJOR SANTERRE is a 55 y.o. male present for OV for  Hypertension/mixed hyperlipidemia/first-degree AV block/chest discomfort/dyspnea/family history of heart disease/coronary artery calcification/LVH Blood pressure is improved today on lisinopril. - Comp Met (CMET)> WNL - Iron, TIBC and Ferritin Panel> WNL - TSH> WNL - CBC w/Diff> WNL - CT CARDIAC SCORING > mild elevation in calcification circumflex Continue lisinopril 2.5 mg daily. Continue atorvastatin 40 mg in the evening Continue baby ASA DC HCTZ. - DC amlodipine. Offered chest x-ray today and he prefer to wait until after his cardiology evaluation. For fatigue/stamina: Could consider vitamin D/B12.  They are already supplementing with vitamin D.  Encouraged him to start B12 500 mcg daily.  Reviewed expectations re: course of current medical issues. Discussed self-management of symptoms. Outlined signs and symptoms indicating need for more acute intervention. Patient verbalized understanding and all questions were  answered. Patient received an After-Visit Summary.    No orders of the defined types were placed in this encounter.  No orders of the defined types were placed in this encounter.  Referral Orders  No referral(s) requested today   > 25 Minutes was dedicated to this patient's encounter to include pre-visit review of chart, face-to-face time with patient and post-visit work- which include documentation and prescribing medications and/or ordering test when necessary.     Note is dictated utilizing voice recognition software. Although note has been proof read prior to signing, occasional typographical errors still can be missed. If any questions arise, please do not hesitate to call for verification.   electronically signed by:  Howard Pouch, DO  [place]

## 2021-01-16 ENCOUNTER — Other Ambulatory Visit: Payer: Self-pay

## 2021-01-16 ENCOUNTER — Ambulatory Visit (INDEPENDENT_AMBULATORY_CARE_PROVIDER_SITE_OTHER): Payer: 59 | Admitting: Cardiology

## 2021-01-16 ENCOUNTER — Encounter: Payer: Self-pay | Admitting: Cardiology

## 2021-01-16 VITALS — BP 124/84 | HR 54 | Ht 71.0 in | Wt 226.0 lb

## 2021-01-16 DIAGNOSIS — Z8249 Family history of ischemic heart disease and other diseases of the circulatory system: Secondary | ICD-10-CM

## 2021-01-16 DIAGNOSIS — R0789 Other chest pain: Secondary | ICD-10-CM

## 2021-01-16 DIAGNOSIS — R0600 Dyspnea, unspecified: Secondary | ICD-10-CM | POA: Diagnosis not present

## 2021-01-16 DIAGNOSIS — R0609 Other forms of dyspnea: Secondary | ICD-10-CM | POA: Insufficient documentation

## 2021-01-16 DIAGNOSIS — I10 Essential (primary) hypertension: Secondary | ICD-10-CM | POA: Diagnosis not present

## 2021-01-16 DIAGNOSIS — I2584 Coronary atherosclerosis due to calcified coronary lesion: Secondary | ICD-10-CM

## 2021-01-16 DIAGNOSIS — I2510 Atherosclerotic heart disease of native coronary artery without angina pectoris: Secondary | ICD-10-CM | POA: Insufficient documentation

## 2021-01-16 DIAGNOSIS — E782 Mixed hyperlipidemia: Secondary | ICD-10-CM

## 2021-01-16 HISTORY — DX: Other forms of dyspnea: R06.09

## 2021-01-16 NOTE — Patient Instructions (Signed)
Medication Instructions:  Your physician recommends that you continue on your current medications as directed. Please refer to the Current Medication list given to you today.  *If you need a refill on your cardiac medications before your next appointment, please call your pharmacy*   Lab Work: Your physician recommends that you return for lab work in: 4 weeks for fasting lipid  If you have labs (blood work) drawn today and your tests are completely normal, you will receive your results only by: MyChart Message (if you have MyChart) OR A paper copy in the mail If you have any lab test that is abnormal or we need to change your treatment, we will call you to review the results.   Testing/Procedures: Your physician has requested that you have an echocardiogram. Echocardiography is a painless test that uses sound waves to create images of your heart. It provides your doctor with information about the size and shape of your heart and how well your heart's chambers and valves are working. This procedure takes approximately one hour. There are no restrictions for this procedure.    Follow-Up: At [facility], you and your health needs are our priority.  As part of our continuing mission to provide you with exceptional heart care, we have created designated Provider Care Teams.  These Care Teams include your primary Cardiologist (physician) and Advanced Practice Providers (APPs -  Physician Assistants and Nurse Practitioners) who all work together to provide you with the care you need, when you need it.  We recommend signing up for the patient portal called "MyChart".  Sign up information is provided on this After Visit Summary.  MyChart is used to connect with patients for Virtual Visits (Telemedicine).  Patients are able to view lab/test results, encounter notes, upcoming appointments, etc.  Non-urgent messages can be sent to your provider as well.   To learn more about what you can do with  MyChart, go to [email or web address].    Your next appointment:   2 month(s)  The format for your next appointment:   In Person  Provider:   Gypsy Balsam, MD   Other Instructions

## 2021-01-16 NOTE — Progress Notes (Signed)
Cardiology Consultation:    Date:  01/16/2021   ID:  Samuel Blackburn, DOB February 22, 1966, MRN [id number]  PCP:  Natalia Leatherwood, DO  Cardiologist:  Gypsy Balsam, MD   Referring MD: Natalia Leatherwood, DO   Chief Complaint  Patient presents with   Shortness of Breath   Chest Pain   elevated calcium score    3 months ago    History of Present Illness:    Samuel Blackburn is a 55 y.o. male who is being seen today for the evaluation of chest pain at the request of Kuneff, Renee A, DO.  Quite complex past medical history and quite aggressive evaluation previously.  Biggest concern he have is the fact that he got multiple family members with premature coronary artery disease.  He also was find to have dyslipidemia and just recently started on statin.  He been complaining of having some exertional shortness of breath as well as some tightness in the chest that happen at different situations.  He did have cardiac catheterization done in 2007, apparently did cardiac catheterization was done after he had episode of syncope eventually conclusion was that those episodes of syncope was vasovagal, he did have at least twice a stress test since that time every single time stress test was negative.  This time however because of his symptomatology decision has been made to pursue calcium score.  Calcium score was done and his calcium score is only 22 small calcification in the circumflex artery.  He is coming to my office to follow-up and discuss that.  Interestingly he is being experiencing some shortness of breath fatigue tiredness and lack of energy since about April he told me that he simply woke up 1 day he did not have energy to do anything.  He was checked for COVID which was negative some changes in his medication has been made and since that time he just does not have energy is weak tired he is not able to run as he used to however he did quite interesting experiment first he tried to stop cholesterol  medication but it did not make an improvement.  Then he went back on it and then he tried to stop his lisinopril and heat for the first time ran with no major problems.  He does have a theory that maybe his symptoms are related to lisinopril which could be the case.  Past Medical History:  Diagnosis Date   Allergies    Bruit    Cervical radiculopathy    Chest pain 10/07/2012   Chicken pox    Depression    Erectile dysfunction    Fatty pancreas    Hepatic steatosis    History of fainting spells of unknown cause    Hyperlipemia    Hypertension    IGT (impaired glucose tolerance)    LAE (left atrial enlargement) 01/2020   "Markedly dilated "   LVH (left ventricular hypertrophy) 02/15/2020   Mild concentric LVH   Migraines    Mitral regurgitation    Polycythemia    Had hematology work-up and condition is benign   Rectal bleeding    Splenomegaly    "Mild "   Stage 3 chronic kidney disease (HCC)    Vitamin D deficiency     Past Surgical History:  Procedure Laterality Date   COLONOSCOPY  20118    Current Medications: Current Meds  Medication Sig   aspirin EC 81 MG tablet Take 162 mg by mouth daily. Swallow  whole.   atorvastatin (LIPITOR) 40 MG tablet Take 1 tablet (40 mg total) by mouth at bedtime.   Cholecalciferol 25 MCG (1000 UT) capsule Take 1,000 Units by mouth daily.   Coenzyme Q10 (CO Q-10) 200 MG CAPS Take 100 capsules by mouth daily.   Dietary Management Product (NEOKE BCAA4) POWD Take 1 g by mouth daily. Unknown strenght   Grape Seed Extract 100 MG CAPS Take 500 mg by mouth daily.   lisinopril (ZESTRIL) 2.5 MG tablet Take 1 tablet (2.5 mg total) by mouth daily.   Multiple Vitamin (MULTIVITAMIN) tablet Take 1 tablet by mouth daily. Unknown strenght   Omega-3 Fatty Acids (FISH OIL) 645 MG CAPS Take 1 tablet by mouth daily.     Allergies:   Dust mite extract   Social History   Socioeconomic History   Marital status: Married    Spouse name: Not on file   Number  of children: Not on file   Years of education: Not on file   Highest education level: Not on file  Occupational History   Not on file  Tobacco Use   Smoking status: Never   Smokeless tobacco: Never  Vaping Use   Vaping Use: Never used  Substance and Sexual Activity   Alcohol use: Not Currently   Drug use: Not Currently   Sexual activity: Not Currently  Other Topics Concern   Not on file  Social History Narrative   Marital status/children/pets: Married   Education/employment: Oncologist, works as an Manufacturing systems engineer.   Safety:      -smoke alarm in the home:Yes     - wears seatbelt: Yes   Social Determinants of Health   Financial Resource Strain: Not on file  Food Insecurity: Not on file  Transportation Needs: Not on file  Physical Activity: Not on file  Stress: Not on file  Social Connections: Not on file     Family History: The patient's family history includes Anxiety disorder in his mother; COPD in his paternal grandfather; Colon cancer in his father; Depression in his maternal grandmother and mother; Diabetes in his father and paternal grandmother; Early death in his mother; Healthy in his brother, daughter, and son; Hearing loss in his paternal grandfather; Heart disease in his father and paternal grandfather; Hyperlipidemia in his father and maternal grandfather; Hypertension in his maternal grandfather; Miscarriages / [place] in his maternal grandmother; Stroke in his mother. ROS:   Please see the history of present illness.    All 14 point review of systems negative except as described per history of present illness.  EKGs/Labs/Other Studies Reviewed:    The following studies were reviewed today:   EKG:  EKG is  ordered today.  The ekg ordered today demonstrates normal sinus rhythm possible left atrial enlargement, nonspecific ST segment changes  Recent Labs: 12/08/2020: ALT 33; BUN 19; Creatinine, Ser 1.26; Hemoglobin 16.1; Platelets 233.0;  Potassium 4.6; Sodium 139; TSH 1.97  Recent Lipid Panel    Component Value Date/Time   CHOL 248 (A) 02/04/2020 0000   TRIG 126 02/04/2020 0000   HDL 38 02/04/2020 0000   LDLCALC 185 02/04/2020 0000    Physical Exam:    VS:  BP 124/84 (BP Location: Right Arm, Patient Position: Sitting)   Pulse (!) 54   Ht 5\' 11"  (1.803 m)   Wt 226 lb (102.5 kg)   SpO2 97%   BMI 31.52 kg/m     Wt Readings from Last 3 Encounters:  01/16/21 226 lb (102.5 kg)  01/11/21 229 lb (103.9 kg)  12/08/20 228 lb (103.4 kg)     GEN:  Well nourished, well developed in no acute distress HEENT: Normal NECK: No JVD; No carotid bruits LYMPHATICS: No lymphadenopathy CARDIAC: RRR, no murmurs, no rubs, no gallops RESPIRATORY:  Clear to auscultation without rales, wheezing or rhonchi  ABDOMEN: Soft, non-tender, non-distended MUSCULOSKELETAL:  No edema; No deformity  SKIN: Warm and dry NEUROLOGIC:  Alert and oriented x 3 PSYCHIATRIC:  Normal affect   ASSESSMENT:    1. Essential hypertension   2. Mixed hyperlipidemia   3. Dyspnea on exertion   4. Atypical chest pain   5. Calcification of coronary artery   6. Family history of premature coronary artery disease    PLAN:    In order of problems listed above:  Atypical chest pain.  His calcium score is only 22.6.  He decided to stop lisinopril and symptoms improved quite dramatically.  He did have 2 stress test previously as well as cardiac catheterization all were negative.  We had a long discussion about what to do with the situation.  I told him to keep doing exercises the way he does if he develops symptomatology let me know.  I want him to stay away from lisinopril.  Asked him to check blood pressure on the regular basis bring it to my office next time when I see him.  We will continue antiplatelets therapy Coronary calcium score being abnormal but only 22 which is relatively low.  The key is risk factors modification the situation.  I do not see  indication to do another evaluation of coronary artery disease at this moment since he is getting much better in terms of symptomatology.  However, I will ask you to have another echocardiogram to reassess left ventricular ejection fraction and look at the left atrial size. Essential hypertension plan as outlined above.  Discontinue small dose of lisinopril and keep checking his blood pressure and bring it to me when he will be back next time Dyslipidemia with a baseline LDL of 185!Marland Kitchen  He was put on Lipitor 40 mg which should bring his cholesterol hopefully by 50% down which anticipate to see his LDL in the neighborhood of 90.  He may require more medications.  We did talk about nonpharmacological way to reduce his cholesterol which could be exercises on the regular basis, proper diet as well as weight loss.  He tells me he snore some however he did have a sleep study done and showed no evidence of obstructive sleep apnea.   Medication Adjustments/Labs and Tests Ordered: Current medicines are reviewed at length with the patient today.  Concerns regarding medicines are outlined above.  No orders of the defined types were placed in this encounter.  No orders of the defined types were placed in this encounter.   Signed, Georgeanna Lea, MD, [facility]. 01/16/2021 4:20 PM    [facility]

## 2021-01-16 NOTE — Progress Notes (Signed)
ekg 

## 2021-01-23 ENCOUNTER — Other Ambulatory Visit: Payer: Self-pay

## 2021-01-23 ENCOUNTER — Ambulatory Visit (HOSPITAL_COMMUNITY): Payer: 59 | Attending: Internal Medicine

## 2021-01-23 DIAGNOSIS — R0789 Other chest pain: Secondary | ICD-10-CM | POA: Insufficient documentation

## 2021-01-23 DIAGNOSIS — R0600 Dyspnea, unspecified: Secondary | ICD-10-CM | POA: Insufficient documentation

## 2021-01-23 DIAGNOSIS — R0609 Other forms of dyspnea: Secondary | ICD-10-CM

## 2021-01-23 LAB — ECHOCARDIOGRAM COMPLETE
Area-P 1/2: 3.43 cm2
S' Lateral: 3.2 cm

## 2021-02-06 LAB — LIPID PANEL
Chol/HDL Ratio: 4 ratio (ref 0.0–5.0)
Cholesterol, Total: 137 mg/dL (ref 100–199)
HDL: 34 mg/dL — ABNORMAL LOW (ref >39)
LDL Chol Calc (NIH): 84 mg/dL (ref 0–99)
Triglycerides: 103 mg/dL (ref 0–149)
VLDL Cholesterol Cal: 19 mg/dL (ref 5–40)

## 2021-03-02 ENCOUNTER — Encounter: Payer: Self-pay | Admitting: Family Medicine

## 2021-03-02 ENCOUNTER — Ambulatory Visit (INDEPENDENT_AMBULATORY_CARE_PROVIDER_SITE_OTHER): Payer: 59 | Admitting: Family Medicine

## 2021-03-02 ENCOUNTER — Other Ambulatory Visit: Payer: Self-pay

## 2021-03-02 VITALS — BP 112/72 | HR 45 | Temp 97.4°F | Ht 71.0 in | Wt 225.0 lb

## 2021-03-02 DIAGNOSIS — I10 Essential (primary) hypertension: Secondary | ICD-10-CM

## 2021-03-02 DIAGNOSIS — E782 Mixed hyperlipidemia: Secondary | ICD-10-CM | POA: Diagnosis not present

## 2021-03-02 MED ORDER — ATORVASTATIN CALCIUM 40 MG PO TABS: 40.0000 mg | ORAL_TABLET | Freq: Every day | ORAL | 11 refills | Status: DC

## 2021-03-02 MED ORDER — LISINOPRIL 2.5 MG PO TABS: 2.5000 mg | ORAL_TABLET | Freq: Every day | ORAL | 5 refills | Status: DC

## 2021-03-02 NOTE — Patient Instructions (Signed)
Great to see you today.  I have refilled the medication(s) we provide.   If labs were collected, we will inform you of lab results once received either by echart message or telephone call.   - echart message- for normal results that have been seen by the patient already.   - telephone call: abnormal results or if patient has not viewed results in their echart.  

## 2021-03-02 NOTE — Progress Notes (Signed)
This visit occurred during the SARS-CoV-2 public health emergency.  Safety protocols were in place, including screening questions prior to the visit, additional usage of staff PPE, and extensive cleaning of exam room while observing appropriate contact time as indicated for disinfecting solutions.    Samuel Blackburn , 31-Jan-1966, 55 y.o., male MRN: [id number] Patient Care Team    Relationship Specialty Notifications Start End  Natalia Leatherwood, DO PCP - General Family Medicine  10/30/20     Chief Complaint  Patient presents with   Hyperlipidemia    CMC; pt is fasting     Subjective: the patient is a 55 y.o. male present for follow-up on hypertension and chest discomfort. He has now established with cardiology.  Patient reports he has started lisinopril 2.5 mg daily and the atorvastatin 40 mg daily  and is tolerating without side effects.   Cardiac CT did result with mild calcifications along the circumflex coronary artery.  He does have an extensive family history of cardiac disease. Patient denies chest pain, shortness of breath, dizziness or lower extremity edema.    Prior note: Pt presents for an OV for Urgent care follow up for chest pain.  He has a significant medical history  Of htn/hld and is not taking his medications of amlodipine, hctz or zetia. He has fhx of MI/stroke with sudden death. He was seen in the urgent care for fatigue, chest tightness and shortness of breath.  The completed a COVID test which was negative and an EKG which was read as normal. Reports his symptoms are mostly resolved now.  He does notice he still getting "breathy "with physical activity such as walking the dog and a little winded even when talking.  He had an episode a few weeks ago when he was at the gym that he became extremely fatigued after working out.  He had needed to take a nap for about 4 hours.  Since that time he has continued to have symptoms up until most recently when he was seen at the  urgent care.  His blood pressure has continued to be mildly above goal. He has had a heart cath in 2006.  CT stress echo in 2014 and 2017. He denies any fever, cough, no recent illness, blood loss.  He reports he tolerates p.o. and is drinking plenty of water daily.  Depression screen PHQ 2/9 10/30/2020  Decreased Interest 0  Down, Depressed, Hopeless 0  PHQ - 2 Score 0    Allergies  Allergen Reactions   Dust Mite Extract     Pollen, dust, and wire   Social History   Social History Narrative   Marital status/children/pets: Married   Education/employment: Oncologist, works as an Manufacturing systems engineer.   Safety:      -smoke alarm in the home:Yes     - wears seatbelt: Yes   Past Medical History:  Diagnosis Date   Allergies    Bruit    Cervical radiculopathy    Chest pain 10/07/2012   Chicken pox    Depression    Erectile dysfunction    Fatty pancreas    Hepatic steatosis    History of fainting spells of unknown cause    Hyperlipemia    Hypertension    IGT (impaired glucose tolerance)    LAE (left atrial enlargement) 01/2020   "Markedly dilated "   LVH (left ventricular hypertrophy) 02/15/2020   Mild concentric LVH   Migraines    Mitral regurgitation  Polycythemia    Had hematology work-up and condition is benign   Rectal bleeding    Splenomegaly    "Mild "   Stage 3 chronic kidney disease (HCC)    Vitamin D deficiency    Past Surgical History:  Procedure Laterality Date   COLONOSCOPY  20118   Family History  Problem Relation Age of Onset   Stroke Mother    Early death Mother    Depression Mother    Anxiety disorder Mother    Diabetes Father    Heart disease Father    Hyperlipidemia Father    Colon cancer Father    Depression Maternal Grandmother    Miscarriages / Stillbirths Maternal Grandmother    Hyperlipidemia Maternal Grandfather    Hypertension Maternal Grandfather    Diabetes Paternal Grandmother    COPD Paternal Grandfather    Hearing  loss Paternal Grandfather    Heart disease Paternal Grandfather    Healthy Brother    Healthy Daughter    Healthy Son    Allergies as of 03/02/2021       Reactions   Dust Mite Extract    Pollen, dust, and wire        Medication List        Accurate as of March 02, 2021  3:09 PM. If you have any questions, ask your nurse or doctor.          STOP taking these medications    amLODipine 2.5 MG tablet Commonly known as: NORVASC Stopped by: Felix Pacini, DO   ezetimibe 10 MG tablet Commonly known as: ZETIA Stopped by: Felix Pacini, DO       TAKE these medications    aspirin EC 81 MG tablet Take 162 mg by mouth daily. Swallow whole.   atorvastatin 40 MG tablet Commonly known as: LIPITOR Take 1 tablet (40 mg total) by mouth at bedtime.   Cholecalciferol 25 MCG (1000 UT) capsule Take 1,000 Units by mouth daily.   Co Q-10 200 MG Caps Take 100 capsules by mouth daily.   Fish Oil 645 MG Caps Take 1 tablet by mouth daily.   Grape Seed Extract 100 MG Caps Take 500 mg by mouth daily.   lisinopril 2.5 MG tablet Commonly known as: Zestril Take 1 tablet (2.5 mg total) by mouth daily.   multivitamin tablet Take 1 tablet by mouth daily. Unknown strenght   Neoke BCAA4 Powd Take 1 g by mouth daily. Unknown strenght        All past medical history, surgical history, allergies, family history, immunizations andmedications were updated in the EMR today and reviewed under the history and medication portions of their EMR.     ROS: Negative, with the exception of above mentioned in HPI   Objective:  BP 112/72   Pulse (!) 45   Temp (!) 97.4 F (36.3 C) (Oral)   Ht 5\' 11"  (1.803 m)   Wt 225 lb (102.1 kg)   SpO2 97%   BMI 31.38 kg/m  Body mass index is 31.38 kg/m. Gen: Afebrile. No acute distress. Pleasant obese male.  HENT: AT. [place].  Eyes:Pupils Equal Round Reactive to light, Extraocular movements intact,  Conjunctiva without redness, discharge or  icterus. CV: RRR no murmur, no edema Chest: CTAB, no wheeze or crackles Neuro:  Normal gait. PERLA. EOMi. Alert. Oriented x3 Psych: Normal affect, dress and demeanor. Normal speech. Normal thought content and judgment..    No results found. No results found. No results found for this or  any previous visit (from the past 24 hour(s)).  Assessment/Plan: Samuel Blackburn is a 55 y.o. male present for OV for  Hypertension/mixed hyperlipidemia/first-degree AV block/chest discomfort/dyspnea/family history of heart disease/coronary artery calcification/LVH Stable.  Continue lisinopril 2.5 mg qd Continue lipitor> viewed rpt lipid panel from July and at goal.  - CT CARDIAC SCORING > mild elevation in calcification circumflex Continue baby ASA Tried: HCTZ., amlodipine. F/u 5.5 mos.   Reviewed expectations re: course of current medical issues. Discussed self-management of symptoms. Outlined signs and symptoms indicating need for more acute intervention. Patient verbalized understanding and all questions were answered. Patient received an After-Visit Summary.    No orders of the defined types were placed in this encounter.  Meds ordered this encounter  Medications   lisinopril (ZESTRIL) 2.5 MG tablet    Sig: Take 1 tablet (2.5 mg total) by mouth daily.    Dispense:  30 tablet    Refill:  5   atorvastatin (LIPITOR) 40 MG tablet    Sig: Take 1 tablet (40 mg total) by mouth at bedtime.    Dispense:  30 tablet    Refill:  11    Referral Orders  No referral(s) requested today     Note is dictated utilizing voice recognition software. Although note has been proof read prior to signing, occasional typographical errors still can be missed. If any questions arise, please do not hesitate to call for verification.   electronically signed by:  Felix Pacini, DO  [facility] [place]

## 2021-03-21 ENCOUNTER — Ambulatory Visit: Payer: 59 | Admitting: Cardiology

## 2021-08-10 ENCOUNTER — Other Ambulatory Visit: Payer: Self-pay | Admitting: Family Medicine

## 2021-08-24 ENCOUNTER — Other Ambulatory Visit: Payer: Self-pay

## 2021-08-24 ENCOUNTER — Ambulatory Visit (INDEPENDENT_AMBULATORY_CARE_PROVIDER_SITE_OTHER): Payer: 59 | Admitting: Family Medicine

## 2021-08-24 ENCOUNTER — Encounter: Payer: Self-pay | Admitting: Family Medicine

## 2021-08-24 VITALS — BP 120/77 | HR 64 | Temp 97.9°F | Ht 71.0 in | Wt 234.8 lb

## 2021-08-24 DIAGNOSIS — E782 Mixed hyperlipidemia: Secondary | ICD-10-CM

## 2021-08-24 DIAGNOSIS — Z23 Encounter for immunization: Secondary | ICD-10-CM

## 2021-08-24 DIAGNOSIS — I10 Essential (primary) hypertension: Secondary | ICD-10-CM

## 2021-08-24 DIAGNOSIS — E559 Vitamin D deficiency, unspecified: Secondary | ICD-10-CM

## 2021-08-24 DIAGNOSIS — N1831 Chronic kidney disease, stage 3a: Secondary | ICD-10-CM | POA: Diagnosis not present

## 2021-08-24 DIAGNOSIS — I2584 Coronary atherosclerosis due to calcified coronary lesion: Secondary | ICD-10-CM

## 2021-08-24 DIAGNOSIS — Z8249 Family history of ischemic heart disease and other diseases of the circulatory system: Secondary | ICD-10-CM

## 2021-08-24 DIAGNOSIS — I517 Cardiomegaly: Secondary | ICD-10-CM

## 2021-08-24 DIAGNOSIS — I2510 Atherosclerotic heart disease of native coronary artery without angina pectoris: Secondary | ICD-10-CM

## 2021-08-24 MED ORDER — ATORVASTATIN CALCIUM 40 MG PO TABS: 40.0000 mg | ORAL_TABLET | Freq: Every day | ORAL | 11 refills | Status: DC

## 2021-08-24 MED ORDER — LISINOPRIL 2.5 MG PO TABS: 2.5000 mg | ORAL_TABLET | Freq: Every day | ORAL | 5 refills | Status: DC

## 2021-08-24 NOTE — Patient Instructions (Addendum)
° °  Next appt schedule as your physical and will cover preventive tasks, chronic conditions and fasting labs.

## 2021-08-24 NOTE — Progress Notes (Signed)
This visit occurred during the SARS-CoV-2 public health emergency.  Safety protocols were in place, including screening questions prior to the visit, additional usage of staff PPE, and extensive cleaning of exam room while observing appropriate contact time as indicated for disinfecting solutions.    Samuel Blackburn , 10/10/1965, 56 y.o., male MRN: [id number] Patient Care Team    Relationship Specialty Notifications Start End  Natalia LeatherwoodKuneff, Amyrie Illingworth A, DO PCP - General Family Medicine  10/30/20   Georgeanna LeaKrasowski, Robert J, MD Consulting Physician Cardiology  08/24/21     Chief Complaint  Patient presents with   Follow-up    Pt is fasting 6 month CMC     Subjective: the patient is a 56 y.o. male present for follow-up on hypertension. He has now established with cardiology.  Patient reports he has started lisinopril 2.5 mg daily and the atorvastatin 40 mg daily  and is tolerating without side effects.   Cardiac CT did result with mild calcifications along the circumflex coronary artery.  He does have an extensive family history of cardiac disease. Patient denies chest pain, shortness of breath, dizziness or lower extremity edema.   Prior note: Pt presents for an OV for Urgent care follow up for chest pain.  He has a significant medical history  Of htn/hld and is not taking his medications of amlodipine, hctz or zetia. He has fhx of MI/stroke with sudden death. He was seen in the urgent care for fatigue, chest tightness and shortness of breath.  The completed a COVID test which was negative and an EKG which was read as normal. Reports his symptoms are mostly resolved now.  He does notice he still getting "breathy "with physical activity such as walking the dog and a little winded even when talking.  He had an episode a few weeks ago when he was at the gym that he became extremely fatigued after working out.  He had needed to take a nap for about 4 hours.  Since that time he has continued to have symptoms up  until most recently when he was seen at the urgent care.  His blood pressure has continued to be mildly above goal. He has had a heart cath in 2006.  CT stress echo in 2014 and 2017. He denies any fever, cough, no recent illness, blood loss.  He reports he tolerates p.o. and is drinking plenty of water daily.  Depression screen PHQ 2/9 10/30/2020  Decreased Interest 0  Down, Depressed, Hopeless 0  PHQ - 2 Score 0    Allergies  Allergen Reactions   Dust Mite Extract     Pollen, dust, and wire   Social History   Social History Narrative   Marital status/children/pets: Married   Education/employment: OncologistBachelor's degree, works as an Manufacturing systems engineerengineering manager.   Safety:      -smoke alarm in the home:Yes     - wears seatbelt: Yes   Past Medical History:  Diagnosis Date   Allergies    Bruit    Cervical radiculopathy    Chest pain 10/07/2012   Chicken pox    Depression    Erectile dysfunction    Fatty pancreas    Hepatic steatosis    History of fainting spells of unknown cause    Hyperlipemia    Hypertension    IGT (impaired glucose tolerance)    LAE (left atrial enlargement) 01/2020   "Markedly dilated "   LVH (left ventricular hypertrophy) 02/15/2020   Mild concentric LVH  Migraines    Mitral regurgitation    Polycythemia    Had hematology work-up and condition is benign   Rectal bleeding    Splenomegaly    "Mild "   Stage 3 chronic kidney disease (HCC)    Vitamin D deficiency    Past Surgical History:  Procedure Laterality Date   COLONOSCOPY  20118   Family History  Problem Relation Age of Onset   Stroke Mother    Early death Mother    Depression Mother    Anxiety disorder Mother    Diabetes Father    Heart disease Father    Hyperlipidemia Father    Colon cancer Father    Depression Maternal Grandmother    Miscarriages / Stillbirths Maternal Grandmother    Hyperlipidemia Maternal Grandfather    Hypertension Maternal Grandfather    Diabetes Paternal Grandmother     COPD Paternal Grandfather    Hearing loss Paternal Grandfather    Heart disease Paternal Grandfather    Healthy Brother    Healthy Daughter    Healthy Son    Allergies as of 08/24/2021       Reactions   Dust Mite Extract    Pollen, dust, and wire        Medication List        Accurate as of August 24, 2021  1:30 PM. If you have any questions, ask your nurse or doctor.          STOP taking these medications    [place]oke BCAA4 Powd Stopped by: Felix Pacini, DO       TAKE these medications    aspirin EC 81 MG tablet Take 162 mg by mouth daily. Swallow whole.   atorvastatin 40 MG tablet Commonly known as: LIPITOR Take 1 tablet (40 mg total) by mouth at bedtime.   Cholecalciferol 25 MCG (1000 UT) capsule Take 1,000 Units by mouth daily.   Co Q-10 100 MG Caps Take 1 capsule by mouth daily.   Fish Oil 645 MG Caps Take 1 tablet by mouth daily.   Grape Seed Extract 100 MG Caps Take 500 mg by mouth daily.   lisinopril 2.5 MG tablet Commonly known as: Zestril Take 1 tablet (2.5 mg total) by mouth daily.   multivitamin tablet Take 1 tablet by mouth daily. Unknown strenght        All past medical history, surgical history, allergies, family history, immunizations andmedications were updated in the EMR today and reviewed under the history and medication portions of their EMR.     ROS: Negative, with the exception of above mentioned in HPI   Objective:  BP 120/77    Pulse 64    Temp 97.9 F (36.6 C)    Ht 5\' 11"  (1.803 m)    Wt 234 lb 12.8 oz (106.5 kg)    SpO2 98%    BMI 32.75 kg/m  Body mass index is 32.75 kg/m. Physical Exam Vitals and nursing note reviewed.  Constitutional:      General: He is not in acute distress.    Appearance: Normal appearance. He is not ill-appearing, toxic-appearing or diaphoretic.  HENT:     Head: Normocephalic and atraumatic.  Eyes:     General: No scleral icterus.       Right eye: No discharge.        Left eye: No  discharge.     Extraocular Movements: Extraocular movements intact.     Pupils: Pupils are equal, round, and reactive to light.  Cardiovascular:     Rate and Rhythm: Normal rate and regular rhythm.  Pulmonary:     Effort: Pulmonary effort is normal. No respiratory distress.     Breath sounds: Normal breath sounds. No wheezing, rhonchi or rales.  Musculoskeletal:     Cervical back: Neck supple.  Lymphadenopathy:     Cervical: No cervical adenopathy.  Skin:    General: Skin is warm and dry.     Coloration: Skin is not jaundiced or pale.     Findings: No rash.  Neurological:     Mental Status: He is alert and oriented to person, place, and time. Mental status is at baseline.  Psychiatric:        Mood and Affect: Mood normal.        Behavior: Behavior normal.        Thought Content: Thought content normal.        Judgment: Judgment normal.    No results found. No results found. No results found for this or any previous visit (from the past 24 hour(s)).  Assessment/Plan: DAMICHAEL HOFMAN is a 56 y.o. male present for OV for  Hypertension/mixed hyperlipidemia/first-degree AV block/chest discomfort/dyspnea/family history of heart disease/coronary artery calcification/LVH Stable. Gained 11 lbs.  Continue lisinopril 2.5 mg qd Continue lipitor> viewed rpt lipid panel from July and at goal.  - CT CARDIAC SCORING > mild elevation in calcification circumflex Continue baby ASA Tried: HCTZ., amlodipine. F/u 5.5 mos ( cpe). Labs due at that time  Reviewed expectations re: course of current medical issues. Discussed self-management of symptoms. Outlined signs and symptoms indicating need for more acute intervention. Patient verbalized understanding and all questions were answered. Patient received an After-Visit Summary.    Orders Placed This Encounter  Procedures   Flu Vaccine QUAD 97mo+IM (Fluarix, Fluzone & Alfiuria Quad PF)    Meds ordered this encounter  Medications    atorvastatin (LIPITOR) 40 MG tablet    Sig: Take 1 tablet (40 mg total) by mouth at bedtime.    Dispense:  30 tablet    Refill:  11   lisinopril (ZESTRIL) 2.5 MG tablet    Sig: Take 1 tablet (2.5 mg total) by mouth daily.    Dispense:  30 tablet    Refill:  5    Referral Orders  No referral(s) requested today     Note is dictated utilizing voice recognition software. Although note has been proof read prior to signing, occasional typographical errors still can be missed. If any questions arise, please do not hesitate to call for verification.   electronically signed by:  Felix Pacini, DO  [facility] [place]

## 2021-11-11 ENCOUNTER — Other Ambulatory Visit: Payer: Self-pay | Admitting: Family Medicine

## 2022-02-08 ENCOUNTER — Ambulatory Visit (INDEPENDENT_AMBULATORY_CARE_PROVIDER_SITE_OTHER): Payer: 59 | Admitting: Family Medicine

## 2022-02-08 ENCOUNTER — Encounter: Payer: Self-pay | Admitting: Family Medicine

## 2022-02-08 VITALS — BP 128/79 | HR 59 | Temp 97.6°F | Ht 71.0 in | Wt 234.0 lb

## 2022-02-08 DIAGNOSIS — Z23 Encounter for immunization: Secondary | ICD-10-CM

## 2022-02-08 DIAGNOSIS — Z79899 Other long term (current) drug therapy: Secondary | ICD-10-CM | POA: Diagnosis not present

## 2022-02-08 DIAGNOSIS — I2510 Atherosclerotic heart disease of native coronary artery without angina pectoris: Secondary | ICD-10-CM

## 2022-02-08 DIAGNOSIS — Z0000 Encounter for general adult medical examination without abnormal findings: Secondary | ICD-10-CM

## 2022-02-08 DIAGNOSIS — Z125 Encounter for screening for malignant neoplasm of prostate: Secondary | ICD-10-CM

## 2022-02-08 DIAGNOSIS — N1831 Chronic kidney disease, stage 3a: Secondary | ICD-10-CM

## 2022-02-08 DIAGNOSIS — I2584 Coronary atherosclerosis due to calcified coronary lesion: Secondary | ICD-10-CM

## 2022-02-08 DIAGNOSIS — E559 Vitamin D deficiency, unspecified: Secondary | ICD-10-CM

## 2022-02-08 DIAGNOSIS — E782 Mixed hyperlipidemia: Secondary | ICD-10-CM | POA: Diagnosis not present

## 2022-02-08 DIAGNOSIS — I10 Essential (primary) hypertension: Secondary | ICD-10-CM | POA: Diagnosis not present

## 2022-02-08 DIAGNOSIS — R931 Abnormal findings on diagnostic imaging of heart and coronary circulation: Secondary | ICD-10-CM

## 2022-02-08 LAB — CBC
HCT: 48 % (ref 39.0–52.0)
Hemoglobin: 16.1 g/dL (ref 13.0–17.0)
MCHC: 33.6 g/dL (ref 30.0–36.0)
MCV: 92.4 fl (ref 78.0–100.0)
Platelets: 252 10*3/uL (ref 150.0–400.0)
RBC: 5.19 Mil/uL (ref 4.22–5.81)
RDW: 12.8 % (ref 11.5–15.5)
WBC: 5.4 10*3/uL (ref 4.0–10.5)

## 2022-02-08 LAB — LIPID PANEL
Cholesterol: 158 mg/dL (ref 0–200)
HDL: 31.2 mg/dL — ABNORMAL LOW (ref >39.00)
LDL Cholesterol: 100 mg/dL — ABNORMAL HIGH (ref 0–99)
NonHDL: 126.93
Total CHOL/HDL Ratio: 5
Triglycerides: 134 mg/dL (ref 0.0–149.0)
VLDL: 26.8 mg/dL (ref 0.0–40.0)

## 2022-02-08 LAB — COMPREHENSIVE METABOLIC PANEL
ALT: 50 U/L (ref 0–53)
AST: 25 U/L (ref 0–37)
Albumin: 4.9 g/dL (ref 3.5–5.2)
Alkaline Phosphatase: 78 U/L (ref 39–117)
BUN: 21 mg/dL (ref 6–23)
CO2: 30 mEq/L (ref 19–32)
Calcium: 9.8 mg/dL (ref 8.4–10.5)
Chloride: 102 mEq/L (ref 96–112)
Creatinine, Ser: 1.35 mg/dL (ref 0.40–1.50)
GFR: 59.16 mL/min — ABNORMAL LOW (ref >60.00)
Glucose, Bld: 99 mg/dL (ref 70–99)
Potassium: 4.5 mEq/L (ref 3.5–5.1)
Sodium: 139 mEq/L (ref 135–145)
Total Bilirubin: 0.7 mg/dL (ref 0.2–1.2)
Total Protein: 7.3 g/dL (ref 6.0–8.3)

## 2022-02-08 LAB — TSH: TSH: 1.91 u[IU]/mL (ref 0.35–5.50)

## 2022-02-08 LAB — PSA: PSA: 1.02 ng/mL (ref 0.10–4.00)

## 2022-02-08 LAB — VITAMIN D 25 HYDROXY (VIT D DEFICIENCY, FRACTURES): VITD: 59.83 ng/mL (ref 30.00–100.00)

## 2022-02-08 LAB — HEMOGLOBIN A1C: Hgb A1c MFr Bld: 5.8 % (ref 4.6–6.5)

## 2022-02-08 MED ORDER — ATORVASTATIN CALCIUM 40 MG PO TABS: 40.0000 mg | ORAL_TABLET | Freq: Every day | ORAL | 11 refills | Status: DC

## 2022-02-08 MED ORDER — LISINOPRIL 2.5 MG PO TABS: 2.5000 mg | ORAL_TABLET | Freq: Every day | ORAL | 5 refills | Status: DC

## 2022-02-08 NOTE — Progress Notes (Signed)
Patient ID: CAMILO MANDER, male  DOB: 1966-05-29, 56 y.o.   MRN: [id number] Patient Care Team    Relationship Specialty Notifications Start End  Natalia Leatherwood, DO PCP - General Family Medicine  10/30/20   Georgeanna Lea, MD Consulting Physician Cardiology  08/24/21     Chief Complaint  Patient presents with   Annual Exam    Pt is fasting    Subjective: ALEM FAHL is a 56 y.o. male present for CPE. All past medical history, surgical history, allergies, family history, immunizations, medications and social history were updated in the electronic medical record today. All recent labs, ED visits and hospitalizations within the last year were reviewed.  Passing blood via colon. Trying to exercise, doing push ups, when increased exercise started passing blood. Does not feel bad. Not able to lose weight. Thinks he has hemorrhoids, but he is not certain. No blood after he stopped doing push ups.  Health maintenance:  Colonoscopy: last screen 10/2016, recommend follow up he thinks 10 yr; Com[facility].  Immunizations:  tdap updated today, influenza (recommend yearly), pna20 completed. zostavax #1 completed today (#2 NV in 2-6 mos), covid completed Infectious disease screening: HIV and Hep C completed.  PSA:  Lab Results  Component Value Date   PSA 1.02 02/08/2022   PSA 1.01 02/04/2020  , pt was counseled on prostate cancer screenings.  Assistive device: none Oxygen YPP:JKDT Patient has a Dental home. Hospitalizations/ED visits: reviewed   Hypertension/HLD Pt reports compliance with lisinopril 2.5 mg qd, zetia and statin. Patient denies chest pain, shortness of breath or lower extremity edema. Pt takes a daily baby ASA. Pt is  prescribed statin.      02/08/2022    1:10 PM 10/30/2020   10:26 AM  Depression screen PHQ 2/9  Decreased Interest 1 0  Down, Depressed, Hopeless 1 0  PHQ - 2 Score 2 0       No data to display                 10/30/2020   10:36 AM  Fall Risk   Falls in the past year? 0  Number falls in past yr: 0  Injury with Fall? 0  Follow up Falls evaluation completed    Immunization History  Administered Date(s) Administered   Influenza,inj,Quad PF,6+ Mos 08/24/2021   Influenza-Unspecified 05/27/2020   PFIZER(Purple Top)SARS-COV-2 Vaccination 10/18/2019, 11/15/2019, 06/29/2020   PNEUMOCOCCAL CONJUGATE-20 01/16/2021   Tdap 02/08/2022   Zoster Recombinat (Shingrix) 02/08/2022    Past Medical History:  Diagnosis Date   Allergies    Bruit    Cervical radiculopathy    Chest pain 10/07/2012   Chicken pox    Depression    Dyspnea on exertion 01/16/2021   Erectile dysfunction    Fatty pancreas    Hepatic steatosis    History of fainting spells of unknown cause    Hyperlipemia    Hypertension    IGT (impaired glucose tolerance)    LAE (left atrial enlargement) 01/2020   "Markedly dilated "   LVH (left ventricular hypertrophy) 02/15/2020   Mild concentric LVH   Migraines    Mitral regurgitation    Polycythemia    Had hematology work-up and condition is benign   Rectal bleeding    Splenomegaly    "Mild "   Stage 3 chronic kidney disease (HCC)    Vitamin D deficiency    Allergies  Allergen Reactions   Dust  Mite Extract     Pollen, dust, and wire   Past Surgical History:  Procedure Laterality Date   COLONOSCOPY  20118   Family History  Problem Relation Age of Onset   Stroke Mother    Early death Mother    Depression Mother    Anxiety disorder Mother    Diabetes Father    Heart disease Father    Hyperlipidemia Father    Colon cancer Father    Depression Maternal Grandmother    Miscarriages / Stillbirths Maternal Grandmother    Hyperlipidemia Maternal Grandfather    Hypertension Maternal Grandfather    Diabetes Paternal Grandmother    COPD Paternal Grandfather    Hearing loss Paternal Grandfather    Heart disease Paternal Grandfather    Healthy Brother    Healthy Daughter     Healthy Son    Social History   Social History Narrative   Marital status/children/pets: Married   Education/employment: OncologistBachelor's degree, works as an Manufacturing systems engineerengineering manager.   Safety:      -smoke alarm in the home:Yes     - wears seatbelt: Yes    Allergies as of 02/08/2022       Reactions   Dust Mite Extract    Pollen, dust, and wire        Medication List        Accurate as of February 08, 2022  6:17 PM. If you have any questions, ask your nurse or doctor.          aspirin EC 81 MG tablet Take 162 mg by mouth daily. Swallow whole.   atorvastatin 40 MG tablet Commonly known as: LIPITOR Take 1 tablet (40 mg total) by mouth at bedtime.   Cholecalciferol 25 MCG (1000 UT) capsule Take 1,000 Units by mouth daily.   Co Q-10 100 MG Caps Take 1 capsule by mouth daily.   ezetimibe 10 MG tablet Commonly known as: ZETIA Take 10 mg by mouth daily.   Fish Oil 645 MG Caps Take 1 tablet by mouth daily.   Grape Seed Extract 100 MG Caps Take 500 mg by mouth daily.   lisinopril 2.5 MG tablet Commonly known as: Zestril Take 1 tablet (2.5 mg total) by mouth daily.   multivitamin tablet Take 1 tablet by mouth daily. Unknown strenght       All past medical history, surgical history, allergies, family history, immunizations andmedications were updated in the EMR today and reviewed under the history and medication portions of their EMR.     Recent Results (from the past 2160 hour(s))  VITAMIN D 25 Hydroxy (Vit-D Deficiency, Fractures)     Status: None   Collection Time: 02/08/22  1:50 PM  Result Value Ref Range   VITD 59.83 30.00 - 100.00 ng/mL  PSA     Status: None   Collection Time: 02/08/22  1:50 PM  Result Value Ref Range   PSA 1.02 0.10 - 4.00 ng/mL    Comment: Test performed using Access Hybritech PSA Assay, a parmagnetic partical, chemiluminecent immunoassay.  TSH     Status: None   Collection Time: 02/08/22  1:50 PM  Result Value Ref Range   TSH 1.91 0.35 -  5.50 uIU/mL  Lipid panel     Status: Abnormal   Collection Time: 02/08/22  1:50 PM  Result Value Ref Range   Cholesterol 158 0 - 200 mg/dL    Comment: ATP III Classification       Desirable:  < 200 mg/dL  Borderline High:  200 - 239 mg/dL          High:  > = 735 mg/dL   Triglycerides 329.9 0.0 - 149.0 mg/dL    Comment: Normal:  <242 mg/dLBorderline High:  150 - 199 mg/dL   HDL 68.34 (L) >19.62 mg/dL   VLDL 22.9 0.0 - 79.8 mg/dL   LDL Cholesterol 921 (H) 0 - 99 mg/dL   Total CHOL/HDL Ratio 5     Comment:                Men          Women1/2 Average Risk     3.4          3.3Average Risk          5.0          4.42X Average Risk          9.6          7.13X Average Risk          15.0          11.0                       NonHDL 126.93     Comment: NOTE:  Non-HDL goal should be 30 mg/dL higher than patient's LDL goal (i.e. LDL goal of < 70 mg/dL, would have non-HDL goal of < 100 mg/dL)  Hemoglobin J9E     Status: None   Collection Time: 02/08/22  1:50 PM  Result Value Ref Range   Hgb A1c MFr Bld 5.8 4.6 - 6.5 %    Comment: Glycemic Control Guidelines for People with Diabetes:Non Diabetic:  <6%Goal of Therapy: <7%Additional Action Suggested:  >8%   Comprehensive metabolic panel     Status: Abnormal   Collection Time: 02/08/22  1:50 PM  Result Value Ref Range   Sodium 139 135 - 145 mEq/L   Potassium 4.5 3.5 - 5.1 mEq/L   Chloride 102 96 - 112 mEq/L   CO2 30 19 - 32 mEq/L   Glucose, Bld 99 70 - 99 mg/dL   BUN 21 6 - 23 mg/dL   Creatinine, Ser 1.74 0.40 - 1.50 mg/dL   Total Bilirubin 0.7 0.2 - 1.2 mg/dL   Alkaline Phosphatase 78 39 - 117 U/L   AST 25 0 - 37 U/L   ALT 50 0 - 53 U/L   Total Protein 7.3 6.0 - 8.3 g/dL   Albumin 4.9 3.5 - 5.2 g/dL   GFR 08.14 (L) >48.18 mL/min    Comment: Calculated using the CKD-EPI Creatinine Equation (2021)   Calcium 9.8 8.4 - 10.5 mg/dL  CBC     Status: None   Collection Time: 02/08/22  1:50 PM  Result Value Ref Range   WBC 5.4 4.0 - 10.5  K/uL   RBC 5.19 4.22 - 5.81 Mil/uL   Platelets 252.0 150.0 - 400.0 K/uL   Hemoglobin 16.1 13.0 - 17.0 g/dL   HCT 56.3 14.9 - 70.2 %   MCV 92.4 78.0 - 100.0 fl   MCHC 33.6 30.0 - 36.0 g/dL   RDW 63.7 85.8 - 85.0 %   Result Date: 12/15/2020 CT CARDIAC SCORING (SELF PAY ONLY) FINDINGS: Coronary arteries: Normal origins. Coronary Calcium Score: Left main: 0 Left anterior descending artery: 0 Left circumflex artery: 22.3 Right coronary artery: 0 Total: 22.3 Percentile: 64 Pericardium: Normal. Ascending Aorta: Normal caliber.  3.4 cm Non-cardiac: See separate report from [facility]. IMPRESSION:  Coronary calcium score of 22.3 isolated to circumflex . This was 75 th percentile for age-, race-, and sex-matched controls.  FINDINGS: Within the visualized portions of the thorax there are no suspicious appearing pulmonary nodules or masses, there is no acute consolidative airspace disease, no pleural effusions, no pneumothorax and no lymphadenopathy. Visualized portions of the upper abdomen are unremarkable. There are no aggressive appearing lytic or blastic lesions noted in the visualized portions of the skeleton.  ROS 14 pt review of systems performed and negative (unless mentioned in an HPI)  Objective: BP 128/79   Pulse (!) 59   Temp 97.6 F (36.4 C) (Oral)   Ht 5\' 11"  (1.803 m)   Wt 234 lb (106.1 kg)   SpO2 98%   BMI 32.64 kg/m  Physical Exam Constitutional:      General: He is not in acute distress.    Appearance: Normal appearance. He is not ill-appearing, toxic-appearing or diaphoretic.  HENT:     Head: Normocephalic and atraumatic.     Right Ear: Tympanic membrane, ear canal and external ear normal. There is no impacted cerumen.     Left Ear: Tympanic membrane, ear canal and external ear normal. There is no impacted cerumen.     Nose: Nose normal. No congestion or rhinorrhea.     Mouth/Throat:     Mouth: Mucous membranes are moist.     Pharynx: Oropharynx is clear. No  oropharyngeal exudate or posterior oropharyngeal erythema.  Eyes:     General: No scleral icterus.       Right eye: No discharge.        Left eye: No discharge.     Extraocular Movements: Extraocular movements intact.     Pupils: Pupils are equal, round, and reactive to light.  Cardiovascular:     Rate and Rhythm: Normal rate and regular rhythm.     Pulses: Normal pulses.     Heart sounds: Normal heart sounds. No murmur heard.    No friction rub. No gallop.  Pulmonary:     Effort: Pulmonary effort is normal. No respiratory distress.     Breath sounds: Normal breath sounds. No stridor. No wheezing, rhonchi or rales.  Chest:     Chest wall: No tenderness.  Abdominal:     General: Abdomen is flat. Bowel sounds are normal. There is no distension.     Palpations: Abdomen is soft. There is no mass.     Tenderness: There is no abdominal tenderness. There is no right CVA tenderness, left CVA tenderness, guarding or rebound.     Hernia: No hernia is present.  Musculoskeletal:        General: No swelling or tenderness. Normal range of motion.     Cervical back: Normal range of motion and neck supple.     Right lower leg: No edema.     Left lower leg: No edema.  Lymphadenopathy:     Cervical: No cervical adenopathy.  Skin:    General: Skin is warm and dry.     Coloration: Skin is not jaundiced.     Findings: No bruising, lesion or rash.  Neurological:     General: No focal deficit present.     Mental Status: He is alert and oriented to person, place, and time. Mental status is at baseline.     Cranial Nerves: No cranial nerve deficit.     Sensory: No sensory deficit.     Motor: No weakness.     Coordination: Coordination normal.  Gait: Gait normal.     Deep Tendon Reflexes: Reflexes normal.  Psychiatric:        Mood and Affect: Mood normal.        Behavior: Behavior normal.        Thought Content: Thought content normal.        Judgment: Judgment normal.     No results  found.  Assessment/plan: EDENILSON AUSTAD is a 56 y.o. male present for CPE Essential hypertension/HLD/calcification of coronary artery Stable [place] coronary artery calcium: 22.3 (64th%) Continue lisinopril 2.5 mg daily Continue Zetia 10 mg daily-prescribed by cardiology Continue atorvastatin 40 mg daily - TSH - Lipid panel - Comprehensive metabolic panel - CBC  Encounter for long-term current use of medication - Hemoglobin A1c Prostate cancer screening - PSA Vitamin D deficiency 15k weekly - VITAMIN D 25 Hydroxy (Vit-D Deficiency, Fractures) Stage 3a chronic kidney disease (HCC) - VITAMIN D 25 Hydroxy (Vit-D Deficiency, Fractures) Need for diphtheria-tetanus-pertussis (Tdap) vaccine - Tdap vaccine greater than or equal to 7yo IM Need for vaccination for zoster - Varicella-zoster vaccine IM Routine general medical examination at a health care facility Colonoscopy: last screen 10/2016, recommend follow up he thinks 10 yr; Completed by [facility].  Immunizations:  tdap updated today, influenza (recommend yearly), pna20 completed. zostavax #1 completed today (#2 NV in 2-6 mos), covid completed Infectious disease screening: HIV and Hep C completed.  Patient was encouraged to exercise greater than 150 minutes a week. Patient was encouraged to choose a diet filled with fresh fruits and vegetables, and lean meats. AVS provided to patient today for education/recommendation on gender specific health and safety maintenance.  Return in about 24 weeks (around 07/26/2022).  Orders Placed This Encounter  Procedures   Tdap vaccine greater than or equal to 7yo IM   Varicella-zoster vaccine IM   VITAMIN D 25 Hydroxy (Vit-D Deficiency, Fractures)   PSA   TSH   Lipid panel   Hemoglobin A1c   Comprehensive metabolic panel   CBC   Meds ordered this encounter  Medications   atorvastatin (LIPITOR) 40 MG tablet    Sig: Take 1 tablet (40 mg total) by mouth at bedtime.    Dispense:   30 tablet    Refill:  11   lisinopril (ZESTRIL) 2.5 MG tablet    Sig: Take 1 tablet (2.5 mg total) by mouth daily.    Dispense:  30 tablet    Refill:  5   Referral Orders  No referral(s) requested today     Note is dictated utilizing voice recognition software. Although note has been proof read prior to signing, occasional typographical errors still can be missed. If any questions arise, please do not hesitate to call for verification.  Electronically signed by: Felix Pacini, DO [facility]

## 2022-02-08 NOTE — Patient Instructions (Addendum)
Return in about 24 weeks (around 07/26/2022).        Great to see you today.  I have refilled the medication(s) we provide.   If labs were collected, we will inform you of lab results once received either by echart message or telephone call.   - echart message- for normal results that have been seen by the patient already.   - telephone call: abnormal results or if patient has not viewed results in their echart.   Health Maintenance, Male Adopting a healthy lifestyle and getting preventive care are important in promoting health and wellness. Ask your health care provider about: The right schedule for you to have regular tests and exams. Things you can do on your own to prevent diseases and keep yourself healthy. What should I know about diet, weight, and exercise? Eat a healthy diet  Eat a diet that includes plenty of vegetables, fruits, low-fat dairy products, and lean protein. Do not eat a lot of foods that are high in solid fats, added sugars, or sodium. Maintain a healthy weight Body mass index (BMI) is a measurement that can be used to identify possible weight problems. It estimates body fat based on height and weight. Your health care provider can help determine your BMI and help you achieve or maintain a healthy weight. Get regular exercise Get regular exercise. This is one of the most important things you can do for your health. Most adults should: Exercise for at least 150 minutes each week. The exercise should increase your heart rate and make you sweat (moderate-intensity exercise). Do strengthening exercises at least twice a week. This is in addition to the moderate-intensity exercise. Spend less time sitting. Even light physical activity can be beneficial. Watch cholesterol and blood lipids Have your blood tested for lipids and cholesterol at 55 years of age, then have this test every 5 years. You may need to have your cholesterol levels checked more often if: Your  lipid or cholesterol levels are high. You are older than 55 years of age. You are at high risk for heart disease. What should I know about cancer screening? Many types of cancers can be detected early and may often be prevented. Depending on your health history and family history, you may need to have cancer screening at various ages. This may include screening for: Colorectal cancer. Prostate cancer. Skin cancer. Lung cancer. What should I know about heart disease, diabetes, and high blood pressure? Blood pressure and heart disease High blood pressure causes heart disease and increases the risk of stroke. This is more likely to develop in people who have high blood pressure readings or are overweight. Talk with your health care provider about your target blood pressure readings. Have your blood pressure checked: Every 3-5 years if you are 26-45 years of age. Every year if you are 66 years old or older. If you are between the ages of 20 and 41 and are a current or former smoker, ask your health care provider if you should have a one-time screening for abdominal aortic aneurysm (AAA). Diabetes Have regular diabetes screenings. This checks your fasting blood sugar level. Have the screening done: Once every three years after age 79 if you are at a normal weight and have a low risk for diabetes. More often and at a younger age if you are overweight or have a high risk for diabetes. What should I know about preventing infection? Hepatitis B If you have a higher risk for hepatitis B, you  should be screened for this virus. Talk with your health care provider to find out if you are at risk for hepatitis B infection. Hepatitis C Blood testing is recommended for: Everyone born from [birth year] through 1964. Anyone with known risk factors for hepatitis C. Sexually transmitted infections (STIs) You should be screened each year for STIs, including gonorrhea and chlamydia, if: You are sexually active and  are younger than 55 years of age. You are older than 55 years of age and your health care provider tells you that you are at risk for this type of infection. Your sexual activity has changed since you were last screened, and you are at increased risk for chlamydia or gonorrhea. Ask your health care provider if you are at risk. Ask your health care provider about whether you are at high risk for HIV. Your health care provider may recommend a prescription medicine to help prevent HIV infection. If you choose to take medicine to prevent HIV, you should first get tested for HIV. You should then be tested every 3 months for as long as you are taking the medicine. Follow these instructions at home: Alcohol use Do not drink alcohol if your health care provider tells you not to drink. If you drink alcohol: Limit how much you have to 0-2 drinks a day. Know how much alcohol is in your drink. In the [place] one drink equals one 12 oz bottle of beer (355 mL), one 5 oz glass of wine (148 mL), or one 1 oz glass of hard liquor (44 mL). Lifestyle Do not use any products that contain nicotine or tobacco. These products include cigarettes, chewing tobacco, and vaping devices, such as e-cigarettes. If you need help quitting, ask your health care provider. Do not use street drugs. Do not share needles. Ask your health care provider for help if you need support or information about quitting drugs. General instructions Schedule regular health, dental, and eye exams. Stay current with your vaccines. Tell your health care provider if: You often feel depressed. You have ever been abused or do not feel safe at home. Summary Adopting a healthy lifestyle and getting preventive care are important in promoting health and wellness. Follow your health care provider's instructions about healthy diet, exercising, and getting tested or screened for diseases. Follow your health care provider's instructions on monitoring your  cholesterol and blood pressure. This information is not intended to replace advice given to you by your health care provider. Make sure you discuss any questions you have with your health care provider. Document Revised: 12/04/2020 Document Reviewed: 12/04/2020 Elsevier Patient Education  2023 [employer].

## 2022-02-12 ENCOUNTER — Telehealth: Payer: Self-pay

## 2022-02-12 NOTE — Telephone Encounter (Signed)
Patient returning call about results.  Please call patient when available.

## 2022-02-12 NOTE — Telephone Encounter (Signed)
Spoke with pt regarding labs and instructions.   

## 2022-02-13 IMAGING — CT CT CARDIAC CORONARY ARTERY CALCIUM SCORE
3 series · 14 of 20 positions shown, 16 images · non-contrast
Comparison: None.
COMPARISON: None.

Addendum:
EXAM:
OVER-READ INTERPRETATION  CT CHEST

The following report is an over-read performed by radiologist Dr.
Pintu Mielke [REDACTED] on 12/15/2020. This
over-read does not include interpretation of cardiac or coronary
anatomy or pathology. The coronary calcium score interpretation by
the cardiologist is attached.
CLINICAL DATA: Cardiovascular Disease Risk stratification
Coronary Calcium Score
TECHNIQUE: A gated, non-contrast computed tomography scan of the heart was
performed using 3mm slice thickness. Axial images were analyzed on a
dedicated workstation. Calcium scoring of the coronary arteries was
performed using the Agatston method.

[Series 2: casc 3.0 best diast 70 % (id) · axial · 0.39mm/px · z∈[+1169,+1256]mm · 4 of 49 slices shown]
[im 10/49  vessel]
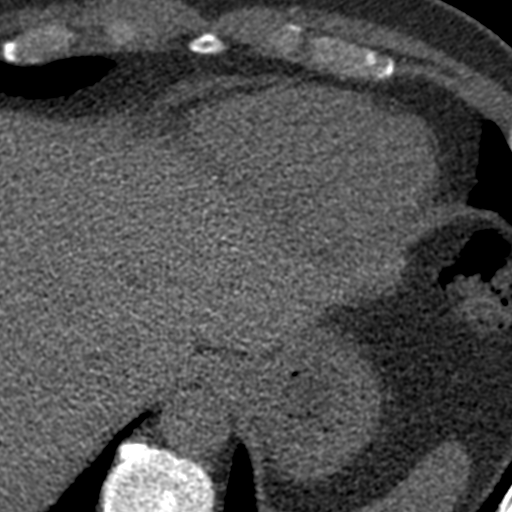
[im 20/49  vessel]
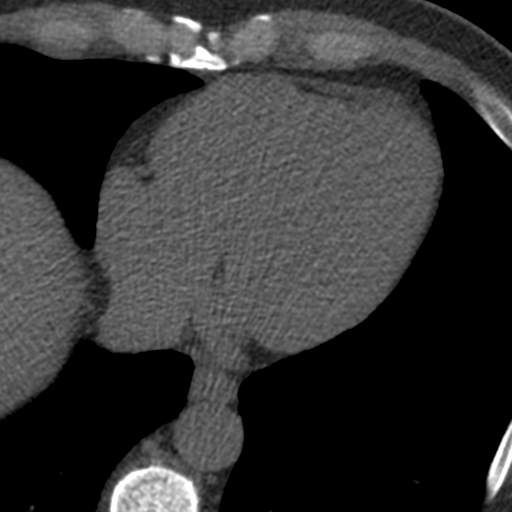
[im 29/49  vessel]
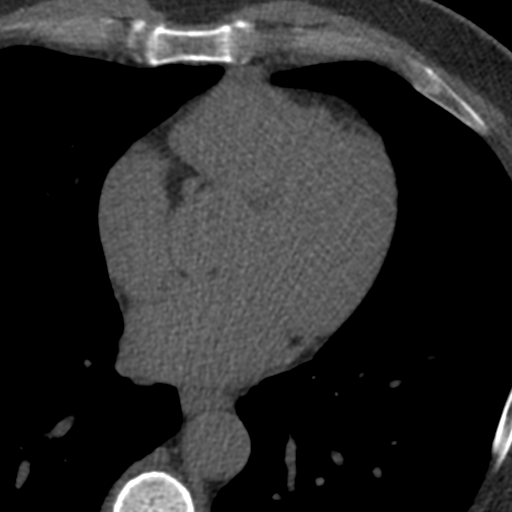
[im 39/49  vessel]
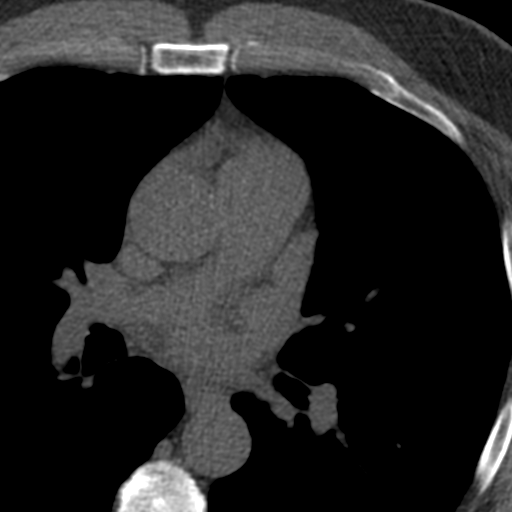

[Series 3: soft full fov 71 % · axial · 0.77mm/px · z∈[+1166,+1262]mm · 5 of 49 slices shown, 7 images]
[im 9/49  vessel]
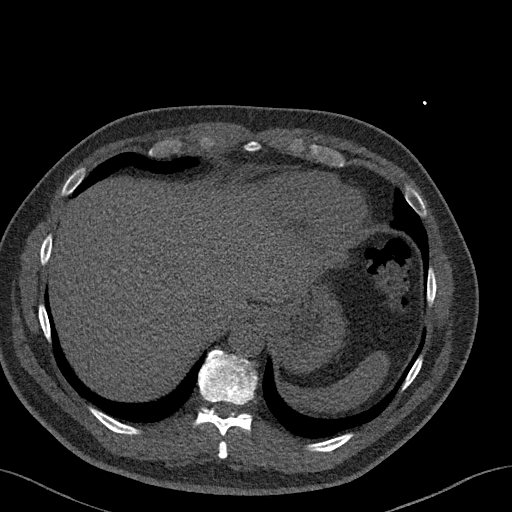
[im 9/49  lung]
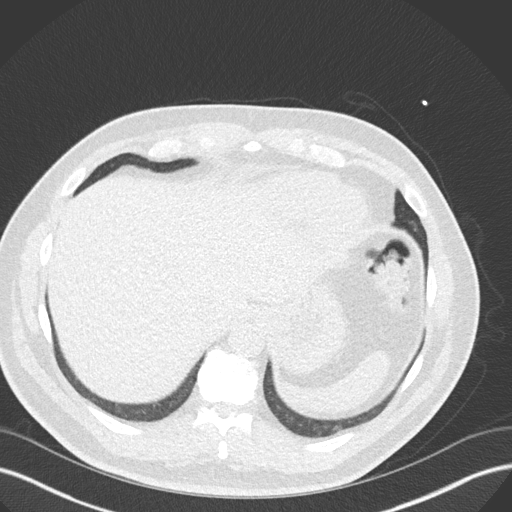
[im 17/49  vessel]
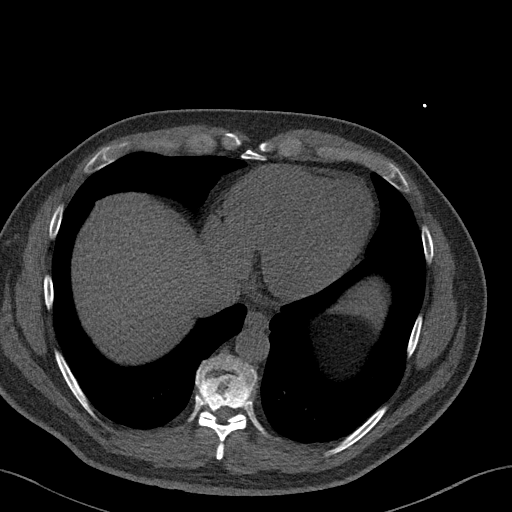
[im 25/49  vessel]
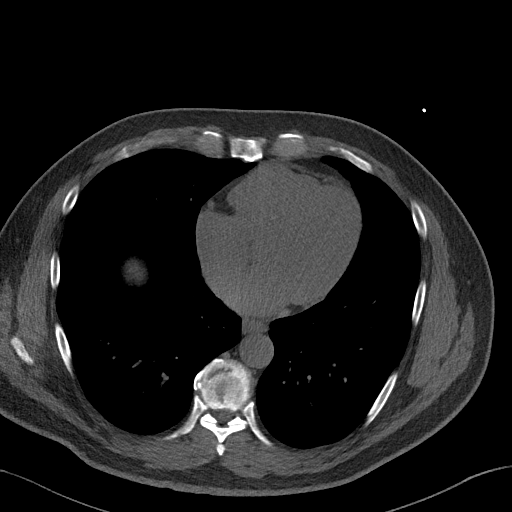
[im 33/49  vessel]
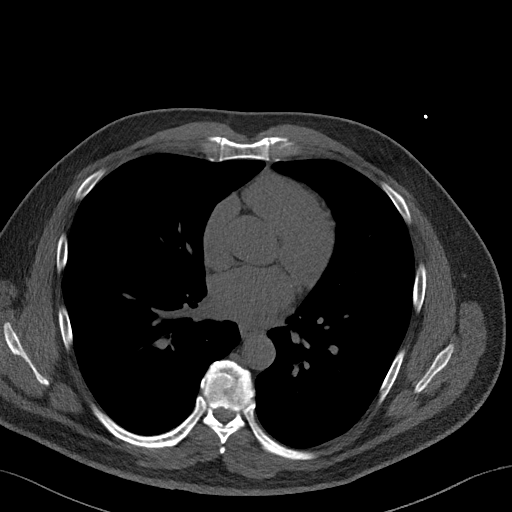
[im 41/49  vessel]
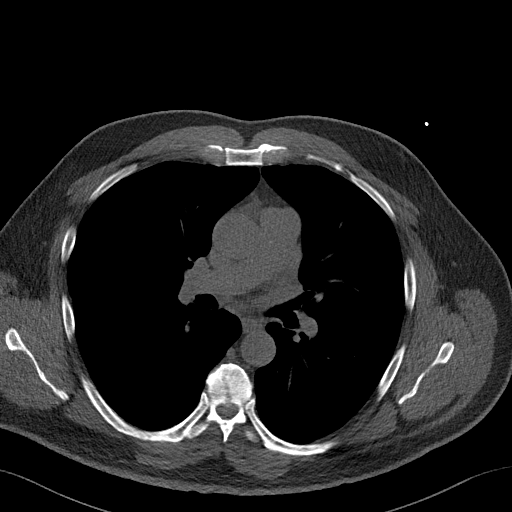
[im 41/49  lung]
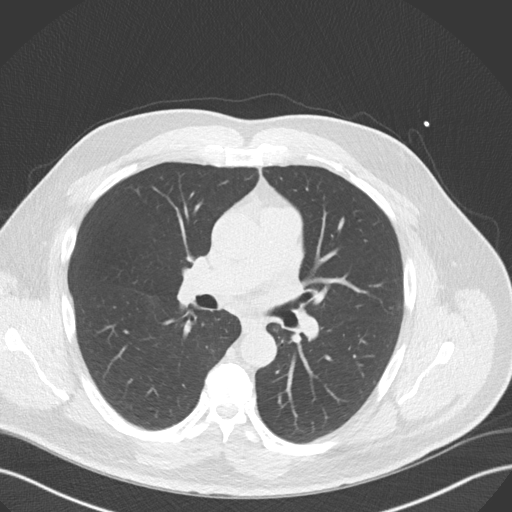

[Series 4: lungs 71 % · axial · 0.77mm/px · z∈[+1166,+1262]mm · 5 of 49 slices shown]
[im 9/49  vessel]
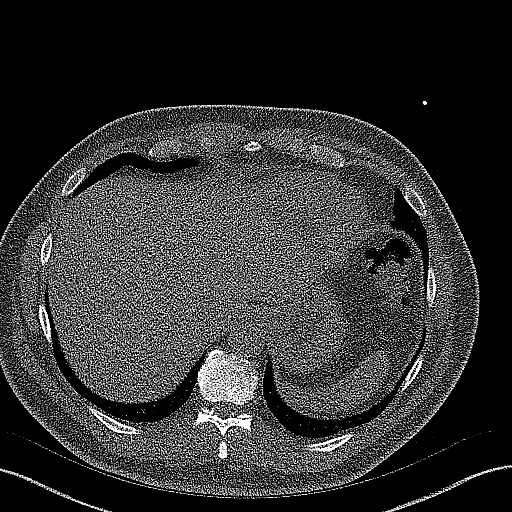
[im 17/49  vessel]
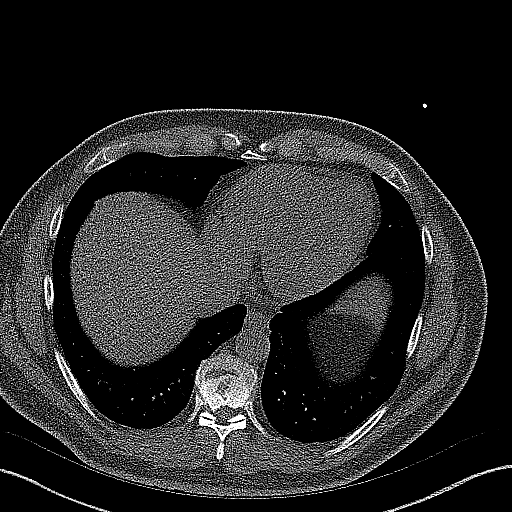
[im 25/49  vessel]
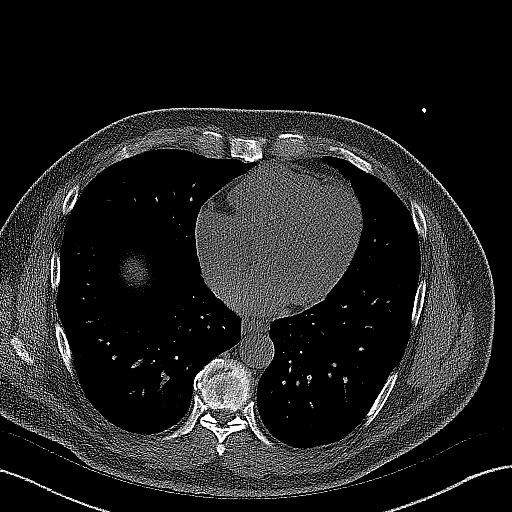
[im 33/49  vessel]
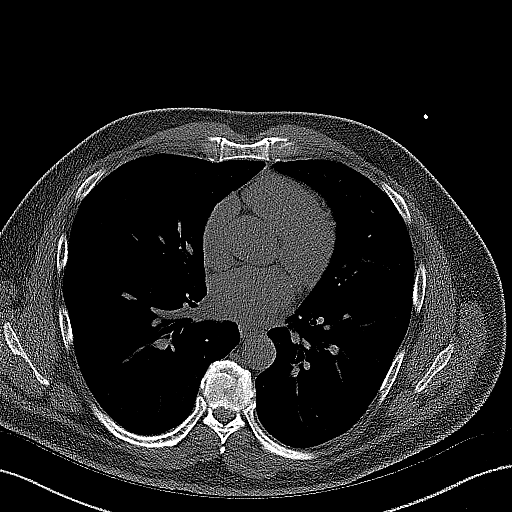
[im 41/49  vessel]
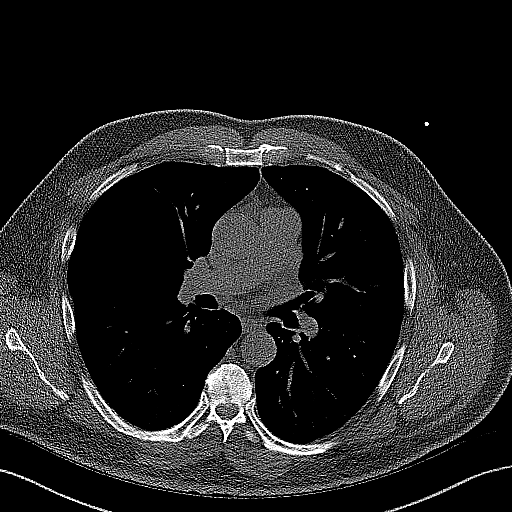

[14 of 20 positions shown; findings below may reference images not displayed]

FINDINGS: Within the visualized portions of the thorax there are no suspicious
appearing pulmonary nodules or masses, there is no acute
consolidative airspace disease, no pleural effusions, no
pneumothorax and no lymphadenopathy. Visualized portions of the
upper abdomen are unremarkable. There are no aggressive appearing
lytic or blastic lesions noted in the visualized portions of the
skeleton.
IMPRESSION: No significant incidental noncardiac findings are noted.
FINDINGS: Coronary arteries: Normal origins.

Coronary Calcium Score:

Left main: 0

Left anterior descending artery: 0

Left circumflex artery:

Right coronary artery: 0

Total:

Percentile: 64

Pericardium: Normal.

Ascending Aorta: Normal caliber.  3.4 cm

Non-cardiac: See separate report from [REDACTED].
IMPRESSION: Coronary calcium score of 22.3 isolated to circumflex . This was 64
th percentile for age-, race-, and sex-matched controls.



If CAC=0, it is reasonable to withhold statin therapy and reassess
in 5 to 10 years, as long as higher risk conditions are absent
(diabetes mellitus, family history of premature CHD in first degree
relatives (males <55 years; females <65 years), cigarette smoking,
or LDL >=190 mg/dL).

If CAC is 1 to 99, it is reasonable to initiate statin therapy for
patients >=55 years of age.

If CAC is >=100 or >=75th percentile, it is reasonable to initiate
statin therapy at any age.

Cardiology referral should be considered for patients with CAC
scores >=400 or >=75th percentile.

*9868 AHA/ACC/AACVPR/AAPA/ABC/LINAAD/JESSA/NOMASIBULELE/Dalvin/MIXALYCH/YANKYERA/ALANA
Guideline on the Management of Blood Cholesterol: A Report of the
American College of Cardiology/American Heart Association Task Force
on Clinical Practice Guidelines. J Am Coll Cardiol.
4988;73(24):7508-7374.

*** End of Addendum ***
EXAM:
OVER-READ INTERPRETATION  CT CHEST

The following report is an over-read performed by radiologist Dr.
Pintu Mielke [REDACTED] on 12/15/2020. This
over-read does not include interpretation of cardiac or coronary
anatomy or pathology. The coronary calcium score interpretation by
the cardiologist is attached.
FINDINGS: Within the visualized portions of the thorax there are no suspicious
appearing pulmonary nodules or masses, there is no acute
consolidative airspace disease, no pleural effusions, no
pneumothorax and no lymphadenopathy. Visualized portions of the
upper abdomen are unremarkable. There are no aggressive appearing
lytic or blastic lesions noted in the visualized portions of the
skeleton.
IMPRESSION: No significant incidental noncardiac findings are noted.

## 2022-07-26 ENCOUNTER — Encounter: Payer: Self-pay | Admitting: Family Medicine

## 2022-07-26 ENCOUNTER — Ambulatory Visit (INDEPENDENT_AMBULATORY_CARE_PROVIDER_SITE_OTHER): Payer: 59 | Admitting: Family Medicine

## 2022-07-26 VITALS — BP 133/86 | HR 74 | Temp 97.6°F | Ht 71.0 in

## 2022-07-26 DIAGNOSIS — E782 Mixed hyperlipidemia: Secondary | ICD-10-CM | POA: Diagnosis not present

## 2022-07-26 DIAGNOSIS — I2510 Atherosclerotic heart disease of native coronary artery without angina pectoris: Secondary | ICD-10-CM

## 2022-07-26 DIAGNOSIS — I10 Essential (primary) hypertension: Secondary | ICD-10-CM | POA: Diagnosis not present

## 2022-07-26 DIAGNOSIS — I2584 Coronary atherosclerosis due to calcified coronary lesion: Secondary | ICD-10-CM

## 2022-07-26 DIAGNOSIS — Z8249 Family history of ischemic heart disease and other diseases of the circulatory system: Secondary | ICD-10-CM | POA: Diagnosis not present

## 2022-07-26 DIAGNOSIS — Z23 Encounter for immunization: Secondary | ICD-10-CM | POA: Diagnosis not present

## 2022-07-26 DIAGNOSIS — I517 Cardiomegaly: Secondary | ICD-10-CM | POA: Diagnosis not present

## 2022-07-26 DIAGNOSIS — R931 Abnormal findings on diagnostic imaging of heart and coronary circulation: Secondary | ICD-10-CM

## 2022-07-26 MED ORDER — LISINOPRIL 2.5 MG PO TABS: 2.5000 mg | ORAL_TABLET | Freq: Every day | ORAL | 5 refills | Status: DC

## 2022-07-26 NOTE — Patient Instructions (Addendum)
Return in about 7 months (around 02/10/2023) for cpe (20 min), Routine chronic condition follow-up.        Great to see you today.  I have refilled the medication(s) we provide.   If labs were collected, we will inform you of lab results once received either by echart message or telephone call.   - echart message- for normal results that have been seen by the patient already.   - telephone call: abnormal results or if patient has not viewed results in their echart.

## 2022-07-26 NOTE — Progress Notes (Signed)
Patient ID: Samuel Blackburn, male  DOB: 1965/09/05, 56 y.o.   MRN: [id number] Patient Care Team    Relationship Specialty Notifications Start End  Natalia Leatherwood, DO PCP - General Family Medicine  10/30/20   Georgeanna Lea, MD Consulting Physician Cardiology  08/24/21     Chief Complaint  Patient presents with   Hypertension    [facility]; pt is fasting     Subjective: Samuel Blackburn is a 56 y.o. male present for chronic conditions management All past medical history, surgical history, allergies, family history, immunizations, medications and social history were updated in the electronic medical record today. All recent labs, ED visits and hospitalizations within the last year were reviewed.  Hypertension/HLD Pt reports compliance with lisinopril 2.5 mg qd  and statin. Patient denies chest pain, shortness of breath, dizziness or lower extremity edema.  Pt takes a daily baby ASA. Pt is  prescribed statin.      07/26/2022    1:14 PM 02/08/2022    1:10 PM 10/30/2020   10:26 AM  Depression screen PHQ 2/9  Decreased Interest 0 1 0  Down, Depressed, Hopeless 0 1 0  PHQ - 2 Score 0 2 0       No data to display                10/30/2020   10:36 AM  Fall Risk   Falls in the past year? 0  Number falls in past yr: 0  Injury with Fall? 0  Follow up Falls evaluation completed    Immunization History  Administered Date(s) Administered   Influenza,inj,Quad PF,6+ Mos 08/24/2021, 07/26/2022   Influenza-Unspecified 05/27/2020   PFIZER(Purple Top)SARS-COV-2 Vaccination 10/18/2019, 11/15/2019, 06/29/2020   PNEUMOCOCCAL CONJUGATE-20 01/16/2021   Tdap 02/08/2022   Zoster Recombinat (Shingrix) 02/08/2022, 07/26/2022    Past Medical History:  Diagnosis Date   Allergies    Bruit    Cervical radiculopathy    Chest pain 10/07/2012   Chicken pox    Depression    Dyspnea on exertion 01/16/2021   Erectile dysfunction    Fatty pancreas    Hepatic steatosis    History of  fainting spells of unknown cause    Hyperlipemia    Hypertension    IGT (impaired glucose tolerance)    LAE (left atrial enlargement) 01/2020   "Markedly dilated "   LVH (left ventricular hypertrophy) 02/15/2020   Mild concentric LVH   Migraines    Mitral regurgitation    Polycythemia    Had hematology work-up and condition is benign   Rectal bleeding    Splenomegaly    "Mild "   Stage 3 chronic kidney disease (HCC)    Vitamin D deficiency    Allergies  Allergen Reactions   Dust Mite Extract     Pollen, dust, and wire   Past Surgical History:  Procedure Laterality Date   COLONOSCOPY  20118   Family History  Problem Relation Age of Onset   Stroke Mother    Early death Mother    Depression Mother    Anxiety disorder Mother    Diabetes Father    Heart disease Father    Hyperlipidemia Father    Colon cancer Father    Depression Maternal Grandmother    Miscarriages / Stillbirths Maternal Grandmother    Hyperlipidemia Maternal Grandfather    Hypertension Maternal Grandfather    Diabetes Paternal Grandmother    COPD Paternal Grandfather    Hearing  loss Paternal Grandfather    Heart disease Paternal Grandfather    Healthy Brother    Healthy Daughter    Healthy Son    Social History   Social History Narrative   Marital status/children/pets: Married   Education/employment: Oncologist, works as an Manufacturing systems engineer.   Safety:      -smoke alarm in the home:Yes     - wears seatbelt: Yes    Allergies as of 07/26/2022       Reactions   Dust Mite Extract    Pollen, dust, and wire        Medication List        Accurate as of July 26, 2022  1:37 PM. If you have any questions, ask your nurse or doctor.          STOP taking these medications    ezetimibe 10 MG tablet Commonly known as: ZETIA Stopped by: Felix Pacini, DO       TAKE these medications    aspirin EC 81 MG tablet Take 162 mg by mouth daily. Swallow whole.   atorvastatin  40 MG tablet Commonly known as: LIPITOR Take 1 tablet (40 mg total) by mouth at bedtime.   Cholecalciferol 25 MCG (1000 UT) capsule Take 1,000 Units by mouth daily.   Co Q-10 100 MG Caps Take 1 capsule by mouth daily.   Fish Oil 645 MG Caps Take 1 tablet by mouth daily.   Grape Seed Extract 100 MG Caps Take 500 mg by mouth daily.   lisinopril 2.5 MG tablet Commonly known as: Zestril Take 1 tablet (2.5 mg total) by mouth daily.   multivitamin tablet Take 1 tablet by mouth daily. Unknown strenght       All past medical history, surgical history, allergies, family history, immunizations andmedications were updated in the EMR today and reviewed under the history and medication portions of their EMR.     No results found for this or any previous visit (from the past 2160 hour(s)).  Result Date: 12/15/2020 CT CARDIAC SCORING (SELF PAY ONLY) FINDINGS: Coronary arteries: Normal origins. Coronary Calcium Score: Left main: 0 Left anterior descending artery: 0 Left circumflex artery: 22.3 Right coronary artery: 0 Total: 22.3 Percentile: 64 Pericardium: Normal. Ascending Aorta: Normal caliber.  3.4 cm Non-cardiac: See separate report from [facility]. IMPRESSION: Coronary calcium score of 22.3 isolated to circumflex . This was 59 th percentile for age-, race-, and sex-matched controls.  FINDINGS: Within the visualized portions of the thorax there are no suspicious appearing pulmonary nodules or masses, there is no acute consolidative airspace disease, no pleural effusions, no pneumothorax and no lymphadenopathy. Visualized portions of the upper abdomen are unremarkable. There are no aggressive appearing lytic or blastic lesions noted in the visualized portions of the skeleton.  ROS 14 pt review of systems performed and negative (unless mentioned in an HPI)  Objective: BP 133/86   Pulse 74   Temp 97.6 F (36.4 C) (Oral)   Ht 5\' 11"  (1.803 m)   SpO2 99%   BMI 32.64 kg/m   Physical Exam Vitals and nursing note reviewed.  Constitutional:      General: He is not in acute distress.    Appearance: Normal appearance. He is not ill-appearing, toxic-appearing or diaphoretic.  HENT:     Head: Normocephalic and atraumatic.  Eyes:     General: No scleral icterus.       Right eye: No discharge.        Left eye: No  discharge.     Extraocular Movements: Extraocular movements intact.     Pupils: Pupils are equal, round, and reactive to light.  Cardiovascular:     Rate and Rhythm: Normal rate and regular rhythm.     Heart sounds: No murmur heard. Pulmonary:     Effort: Pulmonary effort is normal. No respiratory distress.     Breath sounds: Normal breath sounds. No wheezing, rhonchi or rales.  Musculoskeletal:     Cervical back: Neck supple.     Right lower leg: No edema.     Left lower leg: No edema.  Lymphadenopathy:     Cervical: No cervical adenopathy.  Skin:    General: Skin is warm and dry.     Coloration: Skin is not jaundiced or pale.     Findings: No rash.  Neurological:     Mental Status: He is alert and oriented to person, place, and time. Mental status is at baseline.  Psychiatric:        Mood and Affect: Mood normal.        Behavior: Behavior normal.        Thought Content: Thought content normal.        Judgment: Judgment normal.    No results found.  Assessment/plan: TURON KILMER is a 56 y.o. male present for chronic conditions Essential hypertension/HLD/calcification of coronary artery/elevated coronary calcium score-less than 100/64th percentile Stable  Agatston coronary artery calcium: 22.3 (64th%) Continue lisinopril 2.5 mg daily Continue atorvastatin 40 mg daily (intolerant to Crestor) Follow-up mid July for physical-Labs will be due at that time  Influenza vaccine needed - Flu Vaccine QUAD 6+ mos PF IM (Fluarix Quad PF) Need for vaccination for zoster - Varicella-zoster vaccine IM   Return in about 7 months (around  02/10/2023) for cpe (20 min), Routine chronic condition follow-up.  Orders Placed This Encounter  Procedures   Varicella-zoster vaccine IM   Flu Vaccine QUAD 6+ mos PF IM (Fluarix Quad PF)   Meds ordered this encounter  Medications   lisinopril (ZESTRIL) 2.5 MG tablet    Sig: Take 1 tablet (2.5 mg total) by mouth daily.    Dispense:  30 tablet    Refill:  5   Referral Orders  No referral(s) requested today    Note is dictated utilizing voice recognition software. Although note has been proof read prior to signing, occasional typographical errors still can be missed. If any questions arise, please do not hesitate to call for verification.  Electronically signed by: Felix Pacini, DO [facility] [place]

## 2023-02-14 ENCOUNTER — Ambulatory Visit (INDEPENDENT_AMBULATORY_CARE_PROVIDER_SITE_OTHER): Payer: 59 | Admitting: Family Medicine

## 2023-02-14 ENCOUNTER — Encounter: Payer: Self-pay | Admitting: Family Medicine

## 2023-02-14 VITALS — BP 130/82 | HR 59 | Temp 97.7°F | Ht 70.0 in | Wt 242.4 lb

## 2023-02-14 DIAGNOSIS — E782 Mixed hyperlipidemia: Secondary | ICD-10-CM

## 2023-02-14 DIAGNOSIS — Z800 Family history of malignant neoplasm of digestive organs: Secondary | ICD-10-CM

## 2023-02-14 DIAGNOSIS — Z131 Encounter for screening for diabetes mellitus: Secondary | ICD-10-CM

## 2023-02-14 DIAGNOSIS — I2584 Coronary atherosclerosis due to calcified coronary lesion: Secondary | ICD-10-CM

## 2023-02-14 DIAGNOSIS — K625 Hemorrhage of anus and rectum: Secondary | ICD-10-CM | POA: Diagnosis not present

## 2023-02-14 DIAGNOSIS — I10 Essential (primary) hypertension: Secondary | ICD-10-CM

## 2023-02-14 DIAGNOSIS — Z0000 Encounter for general adult medical examination without abnormal findings: Secondary | ICD-10-CM

## 2023-02-14 DIAGNOSIS — Z125 Encounter for screening for malignant neoplasm of prostate: Secondary | ICD-10-CM

## 2023-02-14 DIAGNOSIS — I2510 Atherosclerotic heart disease of native coronary artery without angina pectoris: Secondary | ICD-10-CM

## 2023-02-14 HISTORY — DX: Hemorrhage of anus and rectum: K62.5

## 2023-02-14 MED ORDER — LISINOPRIL 2.5 MG PO TABS: 2.5000 mg | ORAL_TABLET | Freq: Every day | ORAL | 5 refills | Status: DC

## 2023-02-14 MED ORDER — ATORVASTATIN CALCIUM 40 MG PO TABS: 40.0000 mg | ORAL_TABLET | Freq: Every day | ORAL | 11 refills | Status: DC

## 2023-02-14 NOTE — Patient Instructions (Addendum)
Return in about 24 weeks (around 08/01/2023) for Routine chronic condition follow-up.        Great to see you today.  I have refilled the medication(s) we provide.   If labs were collected, we will inform you of lab results once received either by echart message or telephone call.   - echart message- for normal results that have been seen by the patient already.   - telephone call: abnormal results or if patient has not viewed results in their echart.

## 2023-02-14 NOTE — Progress Notes (Signed)
Patient ID: Samuel Blackburn, male  DOB: 02-Jan-1966, 57 y.o.   MRN: [id number] Patient Care Team    Relationship Specialty Notifications Start End  Natalia Leatherwood, DO PCP - General Family Medicine  10/30/20   Georgeanna Lea, MD Consulting Physician Cardiology  08/24/21     Chief Complaint  Patient presents with   Annual Exam    Pt is fasting    Subjective: Samuel Blackburn is a 57 y.o. male present for CPE and Chronic Conditions/illness Management  All past medical history, surgical history, allergies, family history, immunizations, medications and social history were updated in the electronic medical record today. All recent labs, ED visits and hospitalizations within the last year were reviewed.  Health maintenance:  Colonoscopy: fhx colon cancer in father. last screen 2018, recommend follow up he thinks 10 yr; Completed by [facility]. He is having blood  per rectum and frequent BM. He needs to est with new GI. Mild fatigue.  Immunizations:  tdap UTD 2023, influenza (recommend yearly), pna20 completed. zostavax series completed. Infectious disease screening: HIV and Hep C completed.  PSA:  Lab Results  Component Value Date   PSA 1.02 02/08/2022   PSA 1.01 02/04/2020  , pt was counseled on prostate cancer screenings.  Patient has a Dental home. Hospitalizations/ED visits: reviewed   Hypertension/HLD Pt reports compliance with lisinopril 2.5 mg qd, zetia and statin.  Patient denies chest pain, shortness of breath, dizziness or lower extremity edema.  Pt takes a daily baby ASA. Pt is  prescribed statin.      02/14/2023    1:20 PM 07/26/2022    1:14 PM 02/08/2022    1:10 PM 10/30/2020   10:26 AM  Depression screen PHQ 2/9  Decreased Interest 1 0 1 0  Down, Depressed, Hopeless 2 0 1 0  PHQ - 2 Score 3 0 2 0  Altered sleeping 2     Tired, decreased energy 2     Change in appetite 0     Feeling bad or failure about yourself  0     Trouble concentrating 2      Moving slowly or fidgety/restless 0     PHQ-9 Score 9     Difficult doing work/chores Somewhat difficult         02/14/2023    1:21 PM  GAD 7 : Generalized Anxiety Score  Nervous, Anxious, on Edge 0  Control/stop worrying 2  Worry too much - different things 1  Trouble relaxing 3  Restless 1  Easily annoyed or irritable 2  Afraid - awful might happen 1  Total GAD 7 Score 10          02/14/2023    1:19 PM 10/30/2020   10:36 AM  Fall Risk   Falls in the past year? 0 0  Number falls in past yr: 0 0  Injury with Fall? 0 0  Follow up Falls evaluation completed Falls evaluation completed    Immunization History  Administered Date(s) Administered   Influenza,inj,Quad PF,6+ Mos 08/24/2021, 07/26/2022   Influenza-Unspecified 05/27/2020   PFIZER(Purple Top)SARS-COV-2 Vaccination 10/18/2019, 11/15/2019, 06/29/2020   PNEUMOCOCCAL CONJUGATE-20 01/16/2021   Tdap 02/08/2022   Zoster Recombinant(Shingrix) 02/08/2022, 07/26/2022    Past Medical History:  Diagnosis Date   Allergies    Bruit    Cervical radiculopathy    Chest pain 10/07/2012   Chicken pox    Depression    Dyspnea on exertion 01/16/2021   Erectile  dysfunction    Fatty pancreas    Hepatic steatosis    History of fainting spells of unknown cause    Hyperlipemia    Hypertension    IGT (impaired glucose tolerance)    LAE (left atrial enlargement) 01/2020   "Markedly dilated "   LVH (left ventricular hypertrophy) 02/15/2020   Mild concentric LVH   Migraines    Mitral regurgitation    Polycythemia    Had hematology work-up and condition is benign   Rectal bleeding    Splenomegaly    "Mild "   Stage 3 chronic kidney disease (HCC)    Vitamin D deficiency    Allergies  Allergen Reactions   Dust Mite Extract     Pollen, dust, and wire   Past Surgical History:  Procedure Laterality Date   COLONOSCOPY  20118   Family History  Problem Relation Age of Onset   Stroke Mother    Early death Mother     Depression Mother    Anxiety disorder Mother    Diabetes Father    Heart disease Father    Hyperlipidemia Father    Colon cancer Father    Depression Maternal Grandmother    Miscarriages / Stillbirths Maternal Grandmother    Hyperlipidemia Maternal Grandfather    Hypertension Maternal Grandfather    Diabetes Paternal Grandmother    COPD Paternal Grandfather    Hearing loss Paternal Grandfather    Heart disease Paternal Grandfather    Healthy Brother    Healthy Daughter    Healthy Son    Social History   Social History Narrative   Marital status/children/pets: Married   Education/employment: Oncologist, works as an Manufacturing systems engineer.   Safety:      -smoke alarm in the home:Yes     - wears seatbelt: Yes    Allergies as of 02/14/2023       Reactions   Dust Mite Extract    Pollen, dust, and wire        Medication List        Accurate as of February 14, 2023  1:53 PM. If you have any questions, ask your nurse or doctor.          aspirin EC 81 MG tablet Take 162 mg by mouth daily. Swallow whole.   atorvastatin 40 MG tablet Commonly known as: LIPITOR Take 1 tablet (40 mg total) by mouth at bedtime.   Cholecalciferol 25 MCG (1000 UT) capsule Take 1,000 Units by mouth daily.   Co Q-10 100 MG Caps Take 1 capsule by mouth daily.   Fish Oil 645 MG Caps Take 1 tablet by mouth daily.   Grape Seed Extract 100 MG Caps Take 500 mg by mouth daily.   lisinopril 2.5 MG tablet Commonly known as: Zestril Take 1 tablet (2.5 mg total) by mouth daily.   multivitamin tablet Take 1 tablet by mouth daily. Unknown strenght       All past medical history, surgical history, allergies, family history, immunizations andmedications were updated in the EMR today and reviewed under the history and medication portions of their EMR.     No results found for this or any previous visit (from the past 2160 hour(s)).  Result Date: 12/15/2020 CT CARDIAC SCORING (SELF PAY  ONLY) FINDINGS: Coronary arteries: Normal origins. Coronary Calcium Score: Left main: 0 Left anterior descending artery: 0 Left circumflex artery: 22.3 Right coronary artery: 0 Total: 22.3 Percentile: 64 Pericardium: Normal. Ascending Aorta: Normal caliber.  3.4 cm Non-cardiac:  See separate report from [facility]. IMPRESSION: Coronary calcium score of 22.3 isolated to circumflex . This was 5 th percentile for age-, race-, and sex-matched controls.  FINDINGS: Within the visualized portions of the thorax there are no suspicious appearing pulmonary nodules or masses, there is no acute consolidative airspace disease, no pleural effusions, no pneumothorax and no lymphadenopathy. Visualized portions of the upper abdomen are unremarkable. There are no aggressive appearing lytic or blastic lesions noted in the visualized portions of the skeleton.  ROS 14 pt review of systems performed and negative (unless mentioned in an HPI)  Objective: BP 130/82   Pulse (!) 59   Temp 97.7 F (36.5 C)   Ht 5\' 10"  (1.778 m)   Wt 242 lb 6.4 oz (110 kg)   SpO2 98%   BMI 34.78 kg/m  Physical Exam Constitutional:      General: He is not in acute distress.    Appearance: Normal appearance. He is not ill-appearing, toxic-appearing or diaphoretic.  HENT:     Head: Normocephalic and atraumatic.     Right Ear: Tympanic membrane, ear canal and external ear normal. There is no impacted cerumen.     Left Ear: Tympanic membrane, ear canal and external ear normal. There is no impacted cerumen.     Nose: Nose normal. No congestion or rhinorrhea.     Mouth/Throat:     Mouth: Mucous membranes are moist.     Pharynx: Oropharynx is clear. No oropharyngeal exudate or posterior oropharyngeal erythema.  Eyes:     General: No scleral icterus.       Right eye: No discharge.        Left eye: No discharge.     Extraocular Movements: Extraocular movements intact.     Pupils: Pupils are equal, round, and reactive to light.   Cardiovascular:     Rate and Rhythm: Normal rate and regular rhythm.     Pulses: Normal pulses.     Heart sounds: Normal heart sounds. No murmur heard.    No friction rub. No gallop.  Pulmonary:     Effort: Pulmonary effort is normal. No respiratory distress.     Breath sounds: Normal breath sounds. No stridor. No wheezing, rhonchi or rales.  Chest:     Chest wall: No tenderness.  Abdominal:     General: Abdomen is flat. Bowel sounds are normal. There is no distension.     Palpations: Abdomen is soft. There is no mass.     Tenderness: There is no abdominal tenderness. There is no right CVA tenderness, left CVA tenderness, guarding or rebound.     Hernia: No hernia is present.  Musculoskeletal:        General: No swelling or tenderness. Normal range of motion.     Cervical back: Normal range of motion and neck supple.     Right lower leg: No edema.     Left lower leg: No edema.  Lymphadenopathy:     Cervical: No cervical adenopathy.  Skin:    General: Skin is warm and dry.     Coloration: Skin is not jaundiced.     Findings: No bruising, lesion or rash.  Neurological:     General: No focal deficit present.     Mental Status: He is alert and oriented to person, place, and time. Mental status is at baseline.     Cranial Nerves: No cranial nerve deficit.     Sensory: No sensory deficit.     Motor: No weakness.  Coordination: Coordination normal.     Gait: Gait normal.     Deep Tendon Reflexes: Reflexes normal.  Psychiatric:        Mood and Affect: Mood normal.        Behavior: Behavior normal.        Thought Content: Thought content normal.        Judgment: Judgment normal.     No results found.  Assessment/plan: MITSUO BUDNICK is a 57 y.o. male present for CPE and Chronic Conditions/illness Management Essential hypertension/HLD/calcification of coronary artery/ckd Stable Agatston coronary artery calcium: 22.3 (64th%) Continue  lisinopril 2.5 mg daily Continue  Zetia 10 mg daily-prescribed by cardiology Continue  atorvastatin 40 mg daily Cbc, cmp, tsh, lipids collected today BPR/Bowel habit changes/fh colon cancer: Mild fatigue reported  Concerning> referred to GI ASAP. Prior records at [facility] medical and we have not been able to obtain them.  Vitamin D deficiency - VITAMIN D 25 Hydroxy (Vit-D Deficiency, Fractures) collected today Stage 3a chronic kidney disease (HCC) Cmp collected today Diabetes mellitus screening - Hemoglobin A1c Prostate cancer screening - PSA Routine general medical examination at a health care facility Patient was encouraged to exercise greater than 150 minutes a week. Patient was encouraged to choose a diet filled with fresh fruits and vegetables, and lean meats. AVS provided to patient today for education/recommendation on gender specific health and safety maintenance. Colonoscopy: last screen 10/2016, recommend follow up he thinks 10 yr; Com[facility] by [facility].  Immunizations:  tdap UTD 2023, influenza (recommend yearly), pna20 completed. zostavax series completed. Infectious disease screening: HIV and Hep C completed.   Return in about 24 weeks (around 08/01/2023) for Routine chronic condition follow-up.  Orders Placed This Encounter  Procedures   Lipid panel   CBC   Comp Met (CMET)   TSH   Hemoglobin A1c   PSA   Ambulatory referral to Gastroenterology   Meds ordered this encounter  Medications   atorvastatin (LIPITOR) 40 MG tablet    Sig: Take 1 tablet (40 mg total) by mouth at bedtime.    Dispense:  30 tablet    Refill:  11   lisinopril (ZESTRIL) 2.5 MG tablet    Sig: Take 1 tablet (2.5 mg total) by mouth daily.    Dispense:  30 tablet    Refill:  5   Referral Orders         Ambulatory referral to Gastroenterology       Note is dictated utilizing voice recognition software. Although note has been proof read prior to signing, occasional typographical errors still can be missed. If any  questions arise, please do not hesitate to call for verification.  Electronically signed by: Felix Pacini, DO [facility] [place]

## 2023-02-15 LAB — COMPREHENSIVE METABOLIC PANEL
AG Ratio: 1.6 (calc) (ref 1.0–2.5)
ALT: 37 U/L (ref 9–46)
AST: 26 U/L (ref 10–35)
Albumin: 4.6 g/dL (ref 3.6–5.1)
Alkaline phosphatase (APISO): 84 U/L (ref 35–144)
BUN/Creatinine Ratio: 14 (calc) (ref 6–22)
BUN: 20 mg/dL (ref 7–25)
CO2: 24 mmol/L (ref 20–32)
Calcium: 9.9 mg/dL (ref 8.6–10.3)
Chloride: 100 mmol/L (ref 98–110)
Creat: 1.4 mg/dL — ABNORMAL HIGH (ref 0.70–1.30)
Globulin: 2.8 g/dL (calc) (ref 1.9–3.7)
Glucose, Bld: 98 mg/dL (ref 65–99)
Potassium: 4.8 mmol/L (ref 3.5–5.3)
Sodium: 137 mmol/L (ref 135–146)
Total Bilirubin: 0.6 mg/dL (ref 0.2–1.2)
Total Protein: 7.4 g/dL (ref 6.1–8.1)

## 2023-02-15 LAB — CBC
HCT: 51.8 % — ABNORMAL HIGH (ref 38.5–50.0)
Hemoglobin: 17.4 g/dL — ABNORMAL HIGH (ref 13.2–17.1)
MCH: 30.3 pg (ref 27.0–33.0)
MCHC: 33.6 g/dL (ref 32.0–36.0)
MCV: 90.2 fL (ref 80.0–100.0)
MPV: 10.6 fL (ref 7.5–12.5)
Platelets: 292 10*3/uL (ref 140–400)
RBC: 5.74 10*6/uL (ref 4.20–5.80)
RDW: 12.3 % (ref 11.0–15.0)
WBC: 6.8 10*3/uL (ref 3.8–10.8)

## 2023-02-15 LAB — LIPID PANEL
Cholesterol: 230 mg/dL — ABNORMAL HIGH (ref <200)
HDL: 35 mg/dL — ABNORMAL LOW (ref >=40)
LDL Cholesterol (Calc): 163 mg/dL (calc) — ABNORMAL HIGH
Non-HDL Cholesterol (Calc): 195 mg/dL (calc) — ABNORMAL HIGH (ref <130)
Total CHOL/HDL Ratio: 6.6 (calc) — ABNORMAL HIGH (ref <5.0)
Triglycerides: 168 mg/dL — ABNORMAL HIGH (ref <150)

## 2023-02-15 LAB — HEMOGLOBIN A1C
Hgb A1c MFr Bld: 5.8 % of total Hgb — ABNORMAL HIGH (ref <5.7)
Mean Plasma Glucose: 120 mg/dL
eAG (mmol/L): 6.6 mmol/L

## 2023-02-15 LAB — PSA: PSA: 1.12 ng/mL (ref <=4.00)

## 2023-02-15 LAB — TSH: TSH: 1.98 mIU/L (ref 0.40–4.50)

## 2023-02-17 ENCOUNTER — Other Ambulatory Visit: Payer: Self-pay | Admitting: Family Medicine

## 2023-02-17 MED ORDER — ATORVASTATIN CALCIUM 80 MG PO TABS: 80.0000 mg | ORAL_TABLET | Freq: Every day | ORAL | 3 refills | Status: DC

## 2023-02-17 NOTE — Telephone Encounter (Signed)
Please call patient Samuel  and thyroid function are normal Kidney function is Blackburn, Samuel increase hydration-his labs had the appearance of Blackburn least mild dehydration. electrolytes are normal Blood cell counts have Samuel mildly elevated hemoglobin and hematocrit above normal again this is likely due to mild dehydration in fasting state.  Increase hydration. Diabetes screening/A1c is normal Vitamin D levels are normal. PSA/prostate cancer screening normal.  Cholesterol panel is above Blackburn, Samuel Blackburn, Samuel Blackburn 35 Samuel high LDL/bad cholesterol Blackburn 163 and triglycerides above normal Blackburn 168.  I have called in his atorvastatin Blackburn higher dose for him to start taking nightly.

## 2023-02-17 NOTE — Telephone Encounter (Signed)
LM for pt to return call to discuss.  

## 2023-02-18 MED ORDER — ATORVASTATIN CALCIUM 80 MG PO TABS: 80.0000 mg | ORAL_TABLET | Freq: Every day | ORAL | 11 refills | Status: DC

## 2023-02-18 NOTE — Addendum Note (Signed)
Addended by: Filomena Jungling on: 02/18/2023 11:14 AM   Modules accepted: Orders

## 2023-02-18 NOTE — Telephone Encounter (Signed)
Spoke with patient regarding results/recommendations.  

## 2023-02-18 NOTE — Telephone Encounter (Signed)
Pt states that insurance requires 30 tab dispense at a time. I have pended new Rx for 1 yr supply.  Please sign if approved.

## 2023-03-06 ENCOUNTER — Other Ambulatory Visit: Payer: Self-pay | Admitting: Family Medicine

## 2023-04-29 DIAGNOSIS — U071 COVID-19: Secondary | ICD-10-CM

## 2023-04-29 HISTORY — DX: COVID-19: U07.1

## 2023-08-08 ENCOUNTER — Ambulatory Visit (INDEPENDENT_AMBULATORY_CARE_PROVIDER_SITE_OTHER): Payer: 59 | Admitting: Family Medicine

## 2023-08-08 ENCOUNTER — Encounter: Payer: Self-pay | Admitting: Family Medicine

## 2023-08-08 VITALS — BP 138/86 | HR 77 | Temp 98.0°F | Wt 238.8 lb

## 2023-08-08 DIAGNOSIS — R931 Abnormal findings on diagnostic imaging of heart and coronary circulation: Secondary | ICD-10-CM

## 2023-08-08 DIAGNOSIS — E782 Mixed hyperlipidemia: Secondary | ICD-10-CM | POA: Diagnosis not present

## 2023-08-08 DIAGNOSIS — I517 Cardiomegaly: Secondary | ICD-10-CM

## 2023-08-08 DIAGNOSIS — Z800 Family history of malignant neoplasm of digestive organs: Secondary | ICD-10-CM

## 2023-08-08 DIAGNOSIS — Z23 Encounter for immunization: Secondary | ICD-10-CM

## 2023-08-08 DIAGNOSIS — I10 Essential (primary) hypertension: Secondary | ICD-10-CM

## 2023-08-08 DIAGNOSIS — I2510 Atherosclerotic heart disease of native coronary artery without angina pectoris: Secondary | ICD-10-CM

## 2023-08-08 DIAGNOSIS — Z1211 Encounter for screening for malignant neoplasm of colon: Secondary | ICD-10-CM

## 2023-08-08 MED ORDER — LISINOPRIL 5 MG PO TABS: 5.0000 mg | ORAL_TABLET | Freq: Every day | ORAL | 5 refills | Status: DC

## 2023-08-08 MED ORDER — LISINOPRIL 5 MG PO TABS: 5.0000 mg | ORAL_TABLET | Freq: Every day | ORAL | 1 refills | Status: DC

## 2023-08-08 NOTE — Patient Instructions (Addendum)
 Return in about 6 months (around 02/16/2024) for cpe (20 min), Routine chronic condition follow-up.  [facility] Address:  [address] [place], [place], [place] [address] Phone: [phone]       Great to see you today.  I have refilled the medication(s) we provide.   If labs were collected or images ordered, we will inform you of  results once we have received them and reviewed. We will contact you either by echart message, or telephone call.  Please give ample time to the testing facility, and our office to run,  receive and review results. Please do not call inquiring of results, even if you can see them in your chart. We will contact you as soon as we are able. If it has been over 1 week since the test was completed, and you have not yet heard from us , then please call us .    - echart message- for normal results that have been seen by the patient already.   - telephone call: abnormal results or if patient has not viewed results in their echart.  If a referral to a specialist was entered for you, please call us  in 2 weeks if you have not heard from the specialist office to schedule.

## 2023-08-08 NOTE — Progress Notes (Signed)
 Patient ID: Samuel Blackburn, male  DOB: 1966/05/12, 58 y.o.   MRN: [id number] Patient Care Team    Relationship Specialty Notifications Start End  Catherine Charlies LABOR, DO PCP - General Family Medicine  10/30/20   Bernie Lamar PARAS, MD Consulting Physician Cardiology  08/24/21     Chief Complaint  Patient presents with   Hypertension   Hyperlipidemia    Subjective: Samuel Blackburn is a 58 y.o. male present for Chronic Conditions/illness Management  All past medical history, surgical history, allergies, family history, immunizations, medications and social history were updated in the electronic medical record today. All recent labs, ED visits and hospitalizations within the last year were reviewed.  Hypertension/HLD Pt reports compliance with lisinopril  2.5 mg qd,  and Lipitor 40 mg daily- he states he did not increase to 80 mg last visit.   Patient denies chest pain, shortness of breath, dizziness or lower extremity edema.  Pt takes a daily baby ASA. Pt is  prescribed Lipitor 80 mg in the evening.  This was increased last visit.      02/14/2023    1:20 PM 07/26/2022    1:14 PM 02/08/2022    1:10 PM 10/30/2020   10:26 AM  Depression screen PHQ 2/9  Decreased Interest 1 0 1 0  Down, Depressed, Hopeless 2 0 1 0  PHQ - 2 Score 3 0 2 0  Altered sleeping 2     Tired, decreased energy 2     Change in appetite 0     Feeling bad or failure about yourself  0     Trouble concentrating 2     Moving slowly or fidgety/restless 0     PHQ-9 Score 9     Difficult doing work/chores Somewhat difficult         02/14/2023    1:21 PM  GAD 7 : Generalized Anxiety Score  Nervous, Anxious, on Edge 0  Control/stop worrying 2  Worry too much - different things 1  Trouble relaxing 3  Restless 1  Easily annoyed or irritable 2  Afraid - awful might happen 1  Total GAD 7 Score 10          02/14/2023    1:19 PM 10/30/2020   10:36 AM  Fall Risk   Falls in the past year? 0 0  Number falls in  past yr: 0 0  Injury with Fall? 0 0  Follow up Falls evaluation completed Falls evaluation completed    Immunization History  Administered Date(s) Administered   Influenza, Seasonal, Injecte, Preservative Fre 08/08/2023   Influenza,inj,Quad PF,6+ Mos 08/24/2021, 07/26/2022   Influenza-Unspecified 05/27/2020   PFIZER(Purple Top)SARS-COV-2 Vaccination 10/18/2019, 11/15/2019, 06/29/2020   PNEUMOCOCCAL CONJUGATE-20 01/16/2021   Tdap 02/08/2022   Zoster Recombinant(Shingrix ) 02/08/2022, 07/26/2022    Past Medical History:  Diagnosis Date   Allergies    Bruit    Cervical radiculopathy    Chest pain 10/07/2012   Chicken pox    COVID-19 04/2023   Depression    Dyspnea on exertion 01/16/2021   Erectile dysfunction    Fatty pancreas    Hepatic steatosis    History of fainting spells of unknown cause    Hyperlipemia    Hypertension    IGT (impaired glucose tolerance)    LAE (left atrial enlargement) 01/2020   Markedly dilated    LVH (left ventricular hypertrophy) 02/15/2020   Mild concentric LVH   Migraines    Mitral regurgitation  Polycythemia    Had hematology work-up and condition is benign   Rectal bleeding    Splenomegaly    Mild    Stage 3 chronic kidney disease (HCC)    Vitamin D  deficiency    Allergies  Allergen Reactions   Dust Mite Extract     Pollen, dust, and wire   Past Surgical History:  Procedure Laterality Date   COLONOSCOPY  20118   Family History  Problem Relation Age of Onset   Stroke Mother    Early death Mother    Depression Mother    Anxiety disorder Mother    Diabetes Father    Heart disease Father    Hyperlipidemia Father    Colon cancer Father    Depression Maternal Grandmother    Miscarriages / Stillbirths Maternal Grandmother    Hyperlipidemia Maternal Grandfather    Hypertension Maternal Grandfather    Diabetes Paternal Grandmother    COPD Paternal Grandfather    Hearing loss Paternal Grandfather    Heart disease Paternal  Grandfather    Healthy Brother    Healthy Daughter    Healthy Son    Social History   Social History Narrative   Marital status/children/pets: Married   Education/employment: Oncologist, works as an manufacturing systems engineer.   Safety:      -smoke alarm in the home:Yes     - wears seatbelt: Yes    Allergies as of 08/08/2023       Reactions   Dust Mite Extract    Pollen, dust, and wire        Medication List        Accurate as of August 08, 2023  2:19 PM. If you have any questions, ask your nurse or doctor.          aspirin EC 81 MG tablet Take 162 mg by mouth daily. Swallow whole.   atorvastatin  80 MG tablet Commonly known as: LIPITOR Take 1 tablet (80 mg total) by mouth at bedtime.   Cholecalciferol 25 MCG (1000 UT) capsule Take 1,000 Units by mouth daily.   Co Q-10 100 MG Caps Take 1 capsule by mouth daily.   Fish Oil 645 MG Caps Take 1 tablet by mouth daily.   Grape Seed Extract 100 MG Caps Take 500 mg by mouth daily.   lisinopril  5 MG tablet Commonly known as: Zestril  Take 1 tablet (5 mg total) by mouth daily. What changed:  medication strength how much to take Changed by: Demarie Hyneman   multivitamin tablet Take 1 tablet by mouth daily. Unknown strenght       All past medical history, surgical history, allergies, family history, immunizations andmedications were updated in the EMR today and reviewed under the history and medication portions of their EMR.     No results found for this or any previous visit (from the past 2160 hours).  Result Date: 12/15/2020 CT CARDIAC SCORING (SELF PAY ONLY) FINDINGS: Coronary arteries: Normal origins. Coronary Calcium  Score: Left main: 0 Left anterior descending artery: 0 Left circumflex artery: 22.3 Right coronary artery: 0 Total: 22.3 Percentile: 64 Pericardium: Normal. Ascending Aorta: Normal caliber.  3.4 cm Non-cardiac: See separate report from [facility]. IMPRESSION: Coronary calcium  score  of 22.3 isolated to circumflex . This was 56 th percentile for age-, race-, and sex-matched controls.  FINDINGS: Within the visualized portions of the thorax there are no suspicious appearing pulmonary nodules or masses, there is no acute consolidative airspace disease, no pleural effusions, no pneumothorax and no  lymphadenopathy. Visualized portions of the upper abdomen are unremarkable. There are no aggressive appearing lytic or blastic lesions noted in the visualized portions of the skeleton.  ROS 14 pt review of systems performed and negative (unless mentioned in an HPI)  Objective: BP 138/86   Pulse 77   Temp 98 F (36.7 C)   Wt 238 lb 12.8 oz (108.3 kg)   SpO2 97%   BMI 34.26 kg/m  Physical Exam Vitals and nursing note reviewed.  Constitutional:      General: He is not in acute distress.    Appearance: Normal appearance. He is not ill-appearing, toxic-appearing or diaphoretic.  HENT:     Head: Normocephalic and atraumatic.  Eyes:     General: No scleral icterus.       Right eye: No discharge.        Left eye: No discharge.     Extraocular Movements: Extraocular movements intact.     Pupils: Pupils are equal, round, and reactive to light.  Cardiovascular:     Rate and Rhythm: Normal rate and regular rhythm.  Pulmonary:     Effort: Pulmonary effort is normal. No respiratory distress.     Breath sounds: Normal breath sounds. No wheezing, rhonchi or rales.  Musculoskeletal:     Right lower leg: No edema.     Left lower leg: No edema.  Skin:    General: Skin is warm.     Findings: No rash.  Neurological:     Mental Status: He is alert and oriented to person, place, and time. Mental status is at baseline.  Psychiatric:        Mood and Affect: Mood normal.        Behavior: Behavior normal.        Thought Content: Thought content normal.        Judgment: Judgment normal.     No results found.  Assessment/plan: Samuel Blackburn is a 57 y.o. male present for Chronic  Conditions/illness Management Essential hypertension/HLD/calcification of coronary artery/ckd 3A Stable Agatston coronary artery calcium : 22.3 (64th%) Increase to lisinopril  2.5 mg > 5 daily Continue atorvastatin  40 mg daily (he did not increase to 80) we will collect lipids today and if still elevated he is ok taking 80 mg   lipids collected today Exercise is good.   Colon cancer screening/Family history of colon cancer in father - Ambulatory referral to Gastroenterology  Influenza vaccine administered today   Return in about 6 months (around 02/16/2024) for cpe (20 min), Routine chronic condition follow-up.  Orders Placed This Encounter  Procedures   Flu vaccine trivalent PF, 6mos and older(Flulaval,Afluria,Fluarix,Fluzone)   Lipid panel   Ambulatory referral to Gastroenterology   Meds ordered this encounter  Medications   DISCONTD: lisinopril  (ZESTRIL ) 5 MG tablet    Sig: Take 1 tablet (5 mg total) by mouth daily.    Dispense:  90 tablet    Refill:  1   lisinopril  (ZESTRIL ) 5 MG tablet    Sig: Take 1 tablet (5 mg total) by mouth daily.    Dispense:  30 tablet    Refill:  5   Referral Orders         Ambulatory referral to Gastroenterology      Note is dictated utilizing voice recognition software. Although note has been proof read prior to signing, occasional typographical errors still can be missed. If any questions arise, please do not hesitate to call for verification.  Electronically signed by: Charlies Bellini, DO [facility] [place]

## 2023-08-09 LAB — LIPID PANEL
Cholesterol: 195 mg/dL (ref <200)
HDL: 32 mg/dL — ABNORMAL LOW (ref >=40)
LDL Cholesterol (Calc): 132 mg/dL — ABNORMAL HIGH
Non-HDL Cholesterol (Calc): 163 mg/dL — ABNORMAL HIGH (ref <130)
Total CHOL/HDL Ratio: 6.1 (calc) — ABNORMAL HIGH (ref <5.0)
Triglycerides: 174 mg/dL — ABNORMAL HIGH (ref <150)

## 2023-08-21 ENCOUNTER — Encounter: Payer: Self-pay | Admitting: Internal Medicine

## 2023-08-29 ENCOUNTER — Ambulatory Visit: Payer: 59

## 2023-08-29 VITALS — Ht 70.0 in | Wt 241.0 lb

## 2023-08-29 DIAGNOSIS — Z800 Family history of malignant neoplasm of digestive organs: Secondary | ICD-10-CM

## 2023-08-29 MED ORDER — SUFLAVE 178.7 G PO SOLR: 1.0000 | ORAL | 0 refills | Status: DC

## 2023-08-29 NOTE — Progress Notes (Signed)

## 2023-09-09 ENCOUNTER — Encounter: Payer: Self-pay | Admitting: Internal Medicine

## 2023-09-15 ENCOUNTER — Encounter: Payer: Self-pay | Admitting: Certified Registered Nurse Anesthetist

## 2023-09-16 ENCOUNTER — Ambulatory Visit (AMBULATORY_SURGERY_CENTER): Payer: 59 | Admitting: Internal Medicine

## 2023-09-16 ENCOUNTER — Encounter: Payer: Self-pay | Admitting: Internal Medicine

## 2023-09-16 VITALS — BP 112/73 | HR 56 | Temp 97.9°F | Resp 12 | Ht 70.0 in | Wt 241.0 lb

## 2023-09-16 DIAGNOSIS — Z800 Family history of malignant neoplasm of digestive organs: Secondary | ICD-10-CM | POA: Diagnosis not present

## 2023-09-16 DIAGNOSIS — Z1211 Encounter for screening for malignant neoplasm of colon: Secondary | ICD-10-CM

## 2023-09-16 DIAGNOSIS — K6289 Other specified diseases of anus and rectum: Secondary | ICD-10-CM

## 2023-09-16 MED ORDER — SODIUM CHLORIDE 0.9 % IV SOLN: 500.0000 mL | Freq: Once | INTRAVENOUS | Status: DC

## 2023-09-16 NOTE — Patient Instructions (Addendum)
 No polyps or cancer were seen.  Your next routine colonoscopy should be in 5 years - 2030.  I appreciate the opportunity to care for you. Iva Boop, MD, FACG  YOU HAD AN ENDOSCOPIC PROCEDURE TODAY AT THE [facility]:   Refer to the procedure report that was given to you for any specific questions about what was found during the examination.  If the procedure report does not answer your questions, please call your gastroenterologist to clarify.  If you requested that your care partner not be given the details of your procedure findings, then the procedure report has been included in a sealed envelope for you to review at your convenience later.  YOU SHOULD EXPECT: Some feelings of bloating in the abdomen. Passage of more gas than usual.  Walking can help get rid of the air that was put into your GI tract during the procedure and reduce the bloating. If you had a lower endoscopy (such as a colonoscopy or flexible sigmoidoscopy) you may notice spotting of blood in your stool or on the toilet paper. If you underwent a bowel prep for your procedure, you may not have a normal bowel movement for a few days.  Please Note:  You might notice some irritation and congestion in your nose or some drainage.  This is from the oxygen used during your procedure.  There is no need for concern and it should clear up in a day or so.  SYMPTOMS TO REPORT IMMEDIATELY:  Following lower endoscopy (colonoscopy or flexible sigmoidoscopy):  Excessive amounts of blood in the stool  Significant tenderness or worsening of abdominal pains  Swelling of the abdomen that is new, acute  Fever of 100F or higher  For urgent or emergent issues, a gastroenterologist can be reached at any hour by calling [phone]. Do not use MyChart messaging for urgent concerns.    DIET:  We do recommend a small meal at first, but then you may proceed to your regular diet.  Drink plenty of fluids but you should avoid  alcoholic beverages for 24 hours.  ACTIVITY:  You should plan to take it easy for the rest of today and you should NOT DRIVE or use heavy machinery until tomorrow (because of the sedation medicines used during the test).    FOLLOW UP: Our staff will call the number listed on your records the next business day following your procedure.  We will call around 7:15- 8:00 am to check on you and address any questions or concerns that you may have regarding the information given to you following your procedure. If we do not reach you, we will leave a message.     If any biopsies were taken you will be contacted by phone or by letter within the next 1-3 weeks.  Please call us at [phone] if you have not heard about the biopsies in 3 weeks.    SIGNATURES/CONFIDENTIALITY: You and/or your care partner have signed paperwork which will be entered into your electronic medical record.  These signatures attest to the fact that that the information above on your After Visit Summary has been reviewed and is understood.  Full responsibility of the confidentiality of this discharge information lies with you and/or your care-partner.

## 2023-09-16 NOTE — Progress Notes (Signed)
 Pt's states no medical or surgical changes since previsit or office visit.

## 2023-09-16 NOTE — Progress Notes (Signed)
 [facility] History and Physical   Primary Care Physician:  Natalia Leatherwood, DO   Reason for Procedure:   CRCA screening - FHx CRCA - father  Plan:    colonoscopy     HPI: Samuel Blackburn is a 58 y.o. male presenting for screening colonoscopy   Past Medical History:  Diagnosis Date   Allergies    Bruit    Cervical radiculopathy    Chest pain 10/07/2012   Chicken pox    COVID-19 04/2023   Depression    Dyspnea on exertion 01/16/2021   Erectile dysfunction    Fatty pancreas    Hepatic steatosis    History of fainting spells of unknown cause    Hyperlipemia    Hypertension    IGT (impaired glucose tolerance)    LAE (left atrial enlargement) 01/2020   "Markedly dilated "   LVH (left ventricular hypertrophy) 02/15/2020   Mild concentric LVH   Migraines    Mitral regurgitation    Polycythemia    Had hematology work-up and condition is benign   Rectal bleeding    Splenomegaly    "Mild "   Stage 3 chronic kidney disease (HCC)    Vitamin D deficiency     Past Surgical History:  Procedure Laterality Date   COLONOSCOPY  20118   WISDOM TOOTH EXTRACTION     years ago    Prior to Admission medications   Medication Sig Start Date End Date Taking? Authorizing Provider  aspirin EC 81 MG tablet Take 162 mg by mouth daily. Swallow whole. 09/19/20   Kuneff, Renee A, DO  atorvastatin (LIPITOR) 80 MG tablet Take 1 tablet (80 mg total) by mouth at bedtime. 02/18/23   Kuneff, Renee A, DO  Cholecalciferol 25 MCG (1000 UT) capsule Take 1,000 Units by mouth daily. 10/07/12   [provider]  Coenzyme Q10 (CO Q-10) 100 MG CAPS Take 1 capsule by mouth daily. 09/19/20   Kuneff, Renee A, DO  Grape Seed Extract 100 MG CAPS Take 500 mg by mouth daily. 09/19/20   Kuneff, Renee A, DO  lisinopril (ZESTRIL) 5 MG tablet Take 1 tablet (5 mg total) by mouth daily. 08/08/23   Kuneff, Renee A, DO  Multiple Vitamin (MULTIVITAMIN) tablet Take 1 tablet by mouth daily. Unknown  strenght    [provider]  Omega-3 Fatty Acids (FISH OIL) 645 MG CAPS Take 1 tablet by mouth daily. 09/19/20   Kuneff, Renee A, DO  PEG 3350-KCl-NaCl-NaSulf-MgSul (SUFLAVE) 178.7 g SOLR Take 1 kit by mouth as directed. 08/29/23   Iva Boop, MD    Current Outpatient Medications  Medication Sig Dispense Refill   aspirin EC 81 MG tablet Take 162 mg by mouth daily. Swallow whole.     atorvastatin (LIPITOR) 80 MG tablet Take 1 tablet (80 mg total) by mouth at bedtime. 30 tablet 11   Cholecalciferol 25 MCG (1000 UT) capsule Take 1,000 Units by mouth daily.     Coenzyme Q10 (CO Q-10) 100 MG CAPS Take 1 capsule by mouth daily.  0   Grape Seed Extract 100 MG CAPS Take 500 mg by mouth daily.  0   lisinopril (ZESTRIL) 5 MG tablet Take 1 tablet (5 mg total) by mouth daily. 30 tablet 5   Multiple Vitamin (MULTIVITAMIN) tablet Take 1 tablet by mouth daily. Unknown strenght     Omega-3 Fatty Acids (FISH OIL) 645 MG CAPS Take 1 tablet by mouth daily.     Current Facility-Administered Medications  Medication Dose Route Frequency Provider Last Rate Last Admin   0.9 %  sodium chloride infusion  500 mL Intravenous Once Iva Boop, MD        Allergies as of 09/16/2023 - Review Complete 09/16/2023  Allergen Reaction Noted   Dust mite extract Other (See Comments) 10/16/2020    Family History  Problem Relation Age of Onset   Stroke Mother    Early death Mother    Depression Mother    Anxiety disorder Mother    Diabetes Father    Heart disease Father    Hyperlipidemia Father    Colon cancer Father    Healthy Brother    Depression Maternal Grandmother    Miscarriages / Stillbirths Maternal Grandmother    Hyperlipidemia Maternal Grandfather    Hypertension Maternal Grandfather    Diabetes Paternal Grandmother    COPD Paternal Grandfather    Hearing loss Paternal Grandfather    Heart disease Paternal Actor    Healthy Daughter    Healthy Son    Rectal cancer Neg Hx     Stomach cancer Neg Hx    Esophageal cancer Neg Hx     Social History   Socioeconomic History   Marital status: Married    Spouse name: Not on file   Number of children: Not on file   Years of education: Not on file   Highest education level: Not on file  Occupational History   Not on file  Tobacco Use   Smoking status: Never   Smokeless tobacco: Never  Vaping Use   Vaping status: Never Used  Substance and Sexual Activity   Alcohol use: Not Currently   Drug use: Not Currently   Sexual activity: Not Currently  Other Topics Concern   Not on file  Social History Narrative   Marital status/children/pets: Married   Education/employment: Oncologist, works as an Manufacturing systems engineer.   Safety:      -smoke alarm in the home:Yes     - wears seatbelt: Yes   Social Drivers of Corporate investment banker Strain: Not on file  Food Insecurity: Not on file  Transportation Needs: Not on file  Physical Activity: Not on file  Stress: Not on file  Social Connections: Not on file  Intimate Partner Violence: Not on file    Review of Systems:  All other review of systems negative except as mentioned in the HPI.  Physical Exam: Vital signs BP (!) 178/119   Pulse 67   Temp 97.9 F (36.6 C) (Temporal)   Resp 10   Ht 5\' 10"  (1.778 m)   Wt 241 lb (109.3 kg)   SpO2 100%   BMI 34.58 kg/m   General:   Alert,  Well-developed, well-nourished, pleasant and cooperative in NAD Lungs:  Clear throughout to auscultation.   Heart:  Regular rate and rhythm; no murmurs, clicks, rubs,  or gallops. Abdomen:  Soft, nontender and nondistended. Normal bowel sounds.   Neuro/Psych:  Alert and cooperative. Normal mood and affect. A and O x 3   @Raenell Mensing  Sena Slate, MD, [facility] [phone] (pager) 09/16/2023 2:10 PM@

## 2023-09-16 NOTE — Progress Notes (Signed)
 1415 BP 168/114, Labetalol given IV, MD update, vss

## 2023-09-16 NOTE — Progress Notes (Signed)
 1407 BP  178/119, Labetalol given IV, MD update, vss

## 2023-09-16 NOTE — Progress Notes (Signed)
 Report given to PACU, vss

## 2023-09-16 NOTE — Op Note (Signed)
 [facility] Patient Name: Samuel Blackburn Procedure Date: 09/16/2023 1:58 PM MRN: [id number] Endoscopist: Iva Boop , MD, [id number] Age: 58 Referring MD:  Date of Birth: 12/27/1965 Gender: Male Account #: [id number] Procedure:                Colonoscopy Indications:              Screening in patient at increased risk: Colorectal                            cancer in father before age 36 Medicines:                Monitored Anesthesia Care Procedure:                Pre-Anesthesia Assessment:                           - Prior to the procedure, a History and Physical                            was performed, and patient medications and                            allergies were reviewed. The patient's tolerance of                            previous anesthesia was also reviewed. The risks                            and benefits of the procedure and the sedation                            options and risks were discussed with the patient.                            All questions were answered, and informed consent                            was obtained. Prior Anticoagulants: The patient has                            taken no anticoagulant or antiplatelet agents. ASA                            Grade Assessment: II - A patient with mild systemic                            disease. After reviewing the risks and benefits,                            the patient was deemed in satisfactory condition to                            undergo the procedure.  After obtaining informed consent, the colonoscope                            was passed under direct vision. Throughout the                            procedure, the patient's blood pressure, pulse, and                            oxygen saturations were monitored continuously. The                            Olympus Scope SN [id number] was introduced through the                            anus and advanced to the  the cecum, identified by                            appendiceal orifice and ileocecal valve. The                            colonoscopy was performed without difficulty. The                            patient tolerated the procedure well. The quality                            of the bowel preparation was good. The ileocecal                            valve, appendiceal orifice, and rectum were                            photographed. The bowel preparation used was                            SUFLAVE via split dose instruction. Scope In: 2:16:28 PM Scope Out: 2:30:09 PM Scope Withdrawal Time: 0 hours 11 minutes 20 seconds  Total Procedure Duration: 0 hours 13 minutes 41 seconds  Findings:                 The perianal and digital rectal examinations were                            normal.                           The entire examined colon appeared normal on direct                            and retroflexion views. Complications:            No immediate complications. Estimated Blood Loss:     Estimated blood loss: none. Impression:               - The entire  examined colon is normal on direct and                            retroflexion views except for a small hypertrophied                            anal papilla.                           - No specimens collected. Recommendation:           - Patient has a contact number available for                            emergencies. The signs and symptoms of potential                            delayed complications were discussed with the                            patient. Return to normal activities tomorrow.                            Written discharge instructions were provided to the                            patient.                           - Resume previous diet.                           - Continue present medications.                           - Repeat colonoscopy in 5 years for screening                            purposes. Iva Boop, MD 09/16/2023 2:35:35 PM This report has been signed electronically.

## 2023-09-17 ENCOUNTER — Telehealth: Payer: Self-pay | Admitting: *Deleted

## 2023-09-17 NOTE — Telephone Encounter (Signed)
  Follow up Call-     09/16/2023    1:06 PM  Call back number  Post procedure Call Back phone  # [phone]  Permission to leave phone message Yes     Patient questions:  Do you have a fever, pain , or abdominal swelling? No. Pain Score  0 *  Have you tolerated food without any problems? Yes.    Have you been able to return to your normal activities? Yes.    Do you have any questions about your discharge instructions: Diet   No. Medications  No. Follow up visit  No.  Do you have questions or concerns about your Care? No.  Actions: * If pain score is 4 or above: No action needed, pain <4.

## 2023-11-07 ENCOUNTER — Other Ambulatory Visit: Payer: Self-pay

## 2023-11-07 ENCOUNTER — Emergency Department (HOSPITAL_COMMUNITY)

## 2023-11-07 ENCOUNTER — Emergency Department (HOSPITAL_COMMUNITY): Admission: EM | Admit: 2023-11-07 | Discharge: 2023-11-07 | Disposition: A | Discharge status: Home / Self Care

## 2023-11-07 DIAGNOSIS — Z7982 Long term (current) use of aspirin: Secondary | ICD-10-CM | POA: Insufficient documentation

## 2023-11-07 DIAGNOSIS — Z79899 Other long term (current) drug therapy: Secondary | ICD-10-CM | POA: Diagnosis not present

## 2023-11-07 DIAGNOSIS — I129 Hypertensive chronic kidney disease with stage 1 through stage 4 chronic kidney disease, or unspecified chronic kidney disease: Secondary | ICD-10-CM | POA: Diagnosis not present

## 2023-11-07 DIAGNOSIS — N183 Chronic kidney disease, stage 3 unspecified: Secondary | ICD-10-CM | POA: Diagnosis not present

## 2023-11-07 DIAGNOSIS — R0789 Other chest pain: Secondary | ICD-10-CM | POA: Diagnosis not present

## 2023-11-07 DIAGNOSIS — R079 Chest pain, unspecified: Secondary | ICD-10-CM | POA: Diagnosis present

## 2023-11-07 DIAGNOSIS — Z8616 Personal history of COVID-19: Secondary | ICD-10-CM | POA: Diagnosis not present

## 2023-11-07 LAB — BASIC METABOLIC PANEL WITH GFR
Anion gap: 9 (ref 5–15)
BUN: 21 mg/dL — ABNORMAL HIGH (ref 6–20)
CO2: 23 mmol/L (ref 22–32)
Calcium: 9.6 mg/dL (ref 8.9–10.3)
Chloride: 104 mmol/L (ref 98–111)
Creatinine, Ser: 1.28 mg/dL — ABNORMAL HIGH (ref 0.61–1.24)
GFR, Estimated: >60 mL/min (ref >60)
Glucose, Bld: 106 mg/dL — ABNORMAL HIGH (ref 70–99)
Potassium: 4.1 mmol/L (ref 3.5–5.1)
Sodium: 136 mmol/L (ref 135–145)

## 2023-11-07 LAB — CBC
HCT: 50.8 % (ref 39.0–52.0)
Hemoglobin: 16.8 g/dL (ref 13.0–17.0)
MCH: 30.4 pg (ref 26.0–34.0)
MCHC: 33.1 g/dL (ref 30.0–36.0)
MCV: 92 fL (ref 80.0–100.0)
Platelets: 249 10*3/uL (ref 150–400)
RBC: 5.52 MIL/uL (ref 4.22–5.81)
RDW: 12.3 % (ref 11.5–15.5)
WBC: 7.4 10*3/uL (ref 4.0–10.5)
nRBC: 0 % (ref 0.0–0.2)

## 2023-11-07 LAB — TROPONIN I (HIGH SENSITIVITY): Troponin I (High Sensitivity): 16 ng/L (ref <18)

## 2023-11-07 NOTE — Discharge Instructions (Signed)
 You were evaluated in the emergency room for chest pressure.  Your lab work did not show any significant abnormality.  If symptoms persist please follow-up with your primary care doctor.  If you experience any new or worsening symptoms including shortness of breath, dizziness, worsening pain please return to the emergency room.

## 2023-11-07 NOTE — ED Provider Notes (Signed)
 [place] EMERGENCY DEPARTMENT AT [facility] Provider Note   CSN: [id number] Arrival date & time: 11/07/23  1659     History  Chief Complaint  Patient presents with   Chest Pain    Samuel Blackburn is a 58 y.o. male with history of hypertension, hyperlipidemia, CKD presents with complaints of chest and head pressure over the past few days.  States symptoms are intermittent and are not associated with exertion.  He personally denies any cardiac history however endorses strong family history of cardiac events.  Denies prior blood clots.  No cough or congestion.  No blurry vision, dizziness, difficulty ambulating.   Chest Pain  Past Medical History:  Diagnosis Date   Allergies    Bruit    Cervical radiculopathy    Chest pain 10/07/2012   Chicken pox    COVID-19 04/2023   Depression    Dyspnea on exertion 01/16/2021   Erectile dysfunction    Fatty pancreas    Hepatic steatosis    History of fainting spells of unknown cause    Hyperlipemia    Hypertension    IGT (impaired glucose tolerance)    LAE (left atrial enlargement) 01/2020   "Markedly dilated "   LVH (left ventricular hypertrophy) 02/15/2020   Mild concentric LVH   Migraines    Mitral regurgitation    Polycythemia    Had hematology work-up and condition is benign   Rectal bleeding    Splenomegaly    "Mild "   Stage 3 chronic kidney disease (HCC)    Vitamin D deficiency   \    Home Medications Prior to Admission medications   Medication Sig Start Date End Date Taking? Authorizing Provider  aspirin EC 81 MG tablet Take 162 mg by mouth daily. Swallow whole. 09/19/20   Kuneff, Renee A, DO  atorvastatin (LIPITOR) 80 MG tablet Take 1 tablet (80 mg total) by mouth at bedtime. 02/18/23   Kuneff, Renee A, DO  Cholecalciferol 25 MCG (1000 UT) capsule Take 1,000 Units by mouth daily. 10/07/12   [provider]  Coenzyme Q10 (CO Q-10) 100 MG CAPS Take 1 capsule by mouth daily. 09/19/20   Kuneff,  Renee A, DO  Grape Seed Extract 100 MG CAPS Take 500 mg by mouth daily. 09/19/20   Kuneff, Renee A, DO  lisinopril (ZESTRIL) 5 MG tablet Take 1 tablet (5 mg total) by mouth daily. 08/08/23   Kuneff, Renee A, DO  Multiple Vitamin (MULTIVITAMIN) tablet Take 1 tablet by mouth daily. Unknown strenght    [provider]  Omega-3 Fatty Acids (FISH OIL) 645 MG CAPS Take 1 tablet by mouth daily. 09/19/20   Kuneff, Renee A, DO      Allergies    Dust mite extract    Review of Systems   Review of Systems  Cardiovascular:  Positive for chest pain.    Physical Exam Updated Vital Signs BP (!) 152/107 (BP Location: Right Arm)   Pulse (!) 105   Temp 98.8 F (37.1 C)   Resp (!) 22   Ht 5\' 10"  (1.778 m)   Wt 109.8 kg   SpO2 99%   BMI 34.72 kg/m  Physical Exam Vitals and nursing note reviewed.  Constitutional:      General: He is not in acute distress.    Appearance: He is well-developed.  HENT:     Head: Normocephalic and atraumatic.  Eyes:     Conjunctiva/sclera: Conjunctivae normal.  Cardiovascular:     Rate  and Rhythm: Normal rate and regular rhythm.     Heart sounds: No murmur heard. Pulmonary:     Effort: Pulmonary effort is normal. No respiratory distress.     Breath sounds: Normal breath sounds.  Abdominal:     Palpations: Abdomen is soft.     Tenderness: There is no abdominal tenderness.  Musculoskeletal:        General: No swelling.     Cervical back: Neck supple.  Skin:    General: Skin is warm and dry.     Capillary Refill: Capillary refill takes less than 2 seconds.  Neurological:     Mental Status: He is alert.     Comments: Patient is alert and oriented. There is no abnormal phonation. Symmetric smile without facial droop.  Moves all extremities spontaneously. 5/5 strength in upper and lower extremities. . No sensation deficit. There is no nystagmus. EOMI, PERRL. Coordination intact with finger to nose    Psychiatric:        Mood and Affect: Mood normal.      ED Results / Procedures / Treatments   Labs (all labs ordered are listed, but only abnormal results are displayed) Labs Reviewed  BASIC METABOLIC PANEL WITH GFR - Abnormal; Notable for the following components:      Result Value   Glucose, Bld 106 (*)    BUN 21 (*)    Creatinine, Ser 1.28 (*)    All other components within normal limits  CBC  TROPONIN I (HIGH SENSITIVITY)  TROPONIN I (HIGH SENSITIVITY)    EKG EKG Interpretation Date/Time:  Friday November 07 2023 17:10:24 EDT Ventricular Rate:  107 PR Interval:  170 QRS Duration:  94 QT Interval:  316 QTC Calculation: 422 R Axis:   43  Text Interpretation: Sinus tachycardia Ventricular premature complex Low voltage, precordial leads Anteroseptal infarct, old Borderline T abnormalities, inferior leads Baseline wander in lead(s) V6 Confirmed by Beckey Downing [phone]) on 11/07/2023 5:12:48 PM  Radiology DG Chest 2 View Result Date: 11/07/2023 CLINICAL DATA:  C/o head and chest pressure- no pain- for 3x days. EXAM: CHEST - 2 VIEW COMPARISON:  Chest x-ray 07/30/2007 FINDINGS: The heart and mediastinal contours are within normal limits. No focal consolidation. No pulmonary edema. No pleural effusion. No pneumothorax. No acute osseous abnormality. IMPRESSION: No active cardiopulmonary disease. Electronically Signed   By: Tish Frederickson M.D.   On: 11/07/2023 18:35    Procedures Procedures    Medications Ordered in ED Medications - No data to display  ED Course/ Medical Decision Making/ A&P Clinical Course as of 11/07/23 1955  Fri Nov 07, 2023  1950 Troponin I (High Sensitivity): 16 [JT]  1951 Troponin I (High Sensitivity) [JT]    Clinical Course User Index [JT] Halford Decamp, PA-C                                 Medical Decision Making Amount and/or Complexity of Data Reviewed Labs: ordered. Radiology: ordered.   This patient presents to the ED with chief complaint(s) of Chest pain.  The complaint involves an  extensive differential diagnosis and also carries with it a high risk of complications and morbidity.   pertinent past medical history as listed in HPI  The differential diagnosis includes  ACS, PE, aortic dissection, pneumonia, pneumothorax, musculoskeletal, GERD, CVA, TIA The initial plan is to  Will start with basic labs, EKG, chest x-ray Additional history obtained:  Records  reviewed Care Everywhere/External Records  Initial Assessment:   Patient presents hypertensive 152/107 tachycardic to 105 with complaints of chest pressure and pressure over the past few days.  His symptoms are not exertional.  He has no personal cardiac history or prior blood clots.  No cough or congestion to suggest URI symptoms.  His lung sounds are clear.  EKG without ischemic changes.  Describes head pressure headache, he has no sinus tenderness.  No neurodeficits on exam.  Was not sudden or maximal onset.  Do not suspect CVA or TIA.  Low heart score.  Independent ECG interpretation:  Sinus tachycardia without ischemic changes, PVC  Independent labs interpretation:  The following labs were independently interpreted:  CBC unremarkable, BMP with mildly elevated creatinine but within baseline  Independent visualization and interpretation of imaging: I independently visualized the following imaging with scope of interpretation limited to determining acute life threatening conditions related to emergency care: CXR, which revealed no active cardiopulmonary disease  Treatment and Reassessment: No medications administered during visit   Consultations obtained:   none  Disposition:   Patient be discharged home.  Encouraged to follow-up with PCP. The patient has been appropriately medically screened and/or stabilized in the ED. I have low suspicion for any other emergent medical condition which would require further screening, evaluation or treatment in the ED or require inpatient management. At time of discharge the  patient is hemodynamically stable and in no acute distress. I have discussed work-up results and diagnosis with patient and answered all questions. Patient is agreeable with discharge plan. We discussed strict return precautions for returning to the emergency department and they verbalized understanding.     Social Determinants of Health:   none  This note was dictated with voice recognition software.  Despite best efforts at proofreading, errors may have occurred which can change the documentation meaning.          Final Clinical Impression(s) / ED Diagnoses Final diagnoses:  Atypical chest pain    Rx / DC Orders ED Discharge Orders     None         Fabienne Bruns 11/07/23 1956    Durwin Glaze, MD 11/07/23 [phone]

## 2023-11-07 NOTE — ED Triage Notes (Signed)
 Pt arrived via POV. C/o head and chest pressure- no pain- for 3x days.  AOx4

## 2023-12-15 ENCOUNTER — Other Ambulatory Visit: Payer: Self-pay | Admitting: Family Medicine

## 2023-12-26 ENCOUNTER — Telehealth: Payer: Self-pay

## 2023-12-26 NOTE — Telephone Encounter (Signed)
 Surgical clearance forms received on 12/26/23. Patient has been scheduled on n/a to surgical clearance appt. Forms have been placed in PCP tray in front office.  [id number]

## 2023-12-29 NOTE — Telephone Encounter (Signed)
 LVM for pt. Forms placed in PCP office.

## 2024-02-05 NOTE — Telephone Encounter (Signed)
 LM for pt to return call to discuss.

## 2024-02-06 ENCOUNTER — Telehealth: Payer: Self-pay

## 2024-02-06 NOTE — Telephone Encounter (Signed)
 LM for pt to return call to discuss.

## 2024-02-06 NOTE — Telephone Encounter (Signed)
 Copied from CRM [id number]. Topic: General - Other >> Feb 06, 2024 12:08 PM Deleta RAMAN wrote: Reason for CRM: patient is returning a call he does not know where the call comes from or who may have called him. The voice message cut out before the patient can hear any of the information.

## 2024-02-08 ENCOUNTER — Emergency Department (HOSPITAL_COMMUNITY)

## 2024-02-08 ENCOUNTER — Emergency Department (HOSPITAL_COMMUNITY)
Admission: EM | Admit: 2024-02-08 | Discharge: 2024-02-08 | Disposition: A | Discharge status: Home / Self Care | Attending: Emergency Medicine | Admitting: Emergency Medicine

## 2024-02-08 ENCOUNTER — Other Ambulatory Visit: Payer: Self-pay

## 2024-02-08 ENCOUNTER — Encounter (HOSPITAL_COMMUNITY): Payer: Self-pay | Admitting: Emergency Medicine

## 2024-02-08 DIAGNOSIS — N183 Chronic kidney disease, stage 3 unspecified: Secondary | ICD-10-CM | POA: Insufficient documentation

## 2024-02-08 DIAGNOSIS — I129 Hypertensive chronic kidney disease with stage 1 through stage 4 chronic kidney disease, or unspecified chronic kidney disease: Secondary | ICD-10-CM | POA: Diagnosis not present

## 2024-02-08 DIAGNOSIS — Z8616 Personal history of COVID-19: Secondary | ICD-10-CM | POA: Insufficient documentation

## 2024-02-08 DIAGNOSIS — R42 Dizziness and giddiness: Secondary | ICD-10-CM | POA: Diagnosis not present

## 2024-02-08 DIAGNOSIS — Z7982 Long term (current) use of aspirin: Secondary | ICD-10-CM | POA: Diagnosis not present

## 2024-02-08 DIAGNOSIS — Z79899 Other long term (current) drug therapy: Secondary | ICD-10-CM | POA: Diagnosis not present

## 2024-02-08 DIAGNOSIS — R200 Anesthesia of skin: Secondary | ICD-10-CM | POA: Insufficient documentation

## 2024-02-08 DIAGNOSIS — R55 Syncope and collapse: Secondary | ICD-10-CM | POA: Insufficient documentation

## 2024-02-08 LAB — DIFFERENTIAL
Abs Immature Granulocytes: 0.03 K/uL (ref 0.00–0.07)
Basophils Absolute: 0.1 K/uL (ref 0.0–0.1)
Basophils Relative: 1 %
Eosinophils Absolute: 0.3 K/uL (ref 0.0–0.5)
Eosinophils Relative: 5 %
Immature Granulocytes: 1 %
Lymphocytes Relative: 37 %
Lymphs Abs: 2.4 K/uL (ref 0.7–4.0)
Monocytes Absolute: 0.6 K/uL (ref 0.1–1.0)
Monocytes Relative: 9 %
Neutro Abs: 3.1 K/uL (ref 1.7–7.7)
Neutrophils Relative %: 47 %

## 2024-02-08 LAB — APTT: aPTT: 25 s (ref 24–36)

## 2024-02-08 LAB — TROPONIN I (HIGH SENSITIVITY)
Troponin I (High Sensitivity): 13 ng/L (ref <18)
Troponin I (High Sensitivity): 12 ng/L (ref <18)

## 2024-02-08 LAB — BASIC METABOLIC PANEL WITH GFR
Anion gap: 8 (ref 5–15)
BUN: 19 mg/dL (ref 6–20)
CO2: 28 mmol/L (ref 22–32)
Calcium: 9.2 mg/dL (ref 8.9–10.3)
Chloride: 102 mmol/L (ref 98–111)
Creatinine, Ser: 1.33 mg/dL — ABNORMAL HIGH (ref 0.61–1.24)
GFR, Estimated: >60 mL/min (ref >60)
Glucose, Bld: 143 mg/dL — ABNORMAL HIGH (ref 70–99)
Potassium: 4.2 mmol/L (ref 3.5–5.1)
Sodium: 138 mmol/L (ref 135–145)

## 2024-02-08 LAB — CBC
HCT: 48.6 % (ref 39.0–52.0)
Hemoglobin: 16.3 g/dL (ref 13.0–17.0)
MCH: 31 pg (ref 26.0–34.0)
MCHC: 33.5 g/dL (ref 30.0–36.0)
MCV: 92.4 fL (ref 80.0–100.0)
Platelets: 238 K/uL (ref 150–400)
RBC: 5.26 MIL/uL (ref 4.22–5.81)
RDW: 12.8 % (ref 11.5–15.5)
WBC: 6.7 K/uL (ref 4.0–10.5)
nRBC: 0 % (ref 0.0–0.2)

## 2024-02-08 LAB — ETHANOL: Alcohol, Ethyl (B): <15 mg/dL (ref <15)

## 2024-02-08 LAB — PROTIME-INR
INR: 1 (ref 0.8–1.2)
Prothrombin Time: 13.9 s (ref 11.4–15.2)

## 2024-02-08 MED ORDER — CLOPIDOGREL BISULFATE 75 MG PO TABS: 75.0000 mg | ORAL_TABLET | Freq: Every day | ORAL | 0 refills | Status: AC

## 2024-02-08 NOTE — ED Triage Notes (Signed)
 Pt in ambulatory POV with chest discomfort and R arm numbness/tingling, noticed when he woke up around 4am, went to bed normal at 0100. Pt states the numbness has improved some, speech clear and strength is equal. Pt states he took 6 baby aspirin PTA

## 2024-02-08 NOTE — ED Provider Notes (Signed)
 [place] EMERGENCY DEPARTMENT AT [facility] Provider Note  CSN: [id number] Arrival date & time: 02/08/24 0510  Chief Complaint(s) Numbness and Dizziness  HPI Samuel Blackburn is a 58 y.o. male with a past medical history listed below who presents to the emergency department with right arm numbness noted this morning upon waking around 4 AM.  Last known normal was 1 AM.  Patient denies any associated weakness.  He did report getting up out of bed and walking to the bathroom to void.  While voiding, he became lightheaded and felt like he was going to pass out.  He was able to manage getting back in bed.  Wife noted that the patient's color was changing he looked pale.  This improved when he laid back down.  Patient denied any associated vision changes, focal weakness, chest pain, shortness of breath during this episode.  No recent fevers or infections.  No coughing or congestion.  No nausea or vomiting.  No diarrhea.  Patient reports that the numbness lasted approximately 5 minutes and resolved.  Reports that it felt different than falling asleep on the extremity.  The history is provided by the patient.    Past Medical History Past Medical History:  Diagnosis Date   Allergies    Bruit    Cervical radiculopathy    Chest pain 10/07/2012   Chicken pox    COVID-19 04/2023   Depression    Dyspnea on exertion 01/16/2021   Erectile dysfunction    Fatty pancreas    Hepatic steatosis    History of fainting spells of unknown cause    Hyperlipemia    Hypertension    IGT (impaired glucose tolerance)    LAE (left atrial enlargement) 01/2020   Markedly dilated    LVH (left ventricular hypertrophy) 02/15/2020   Mild concentric LVH   Migraines    Mitral regurgitation    Polycythemia    Had hematology work-up and condition is benign   Rectal bleeding    Splenomegaly    Mild    Stage 3 chronic kidney disease (HCC)    Vitamin D  deficiency    Patient Active Problem List    Diagnosis Date Noted   Blood per rectum 02/14/2023   Family history of colon cancer in father 02/14/2023   Agatston coronary artery calcium  score less than 100 (22.3-64th%) 02/08/2022   Calcification of coronary artery 01/16/2021   Family history of premature coronary artery disease 12/11/2020   Essential hypertension 10/30/2020   First degree AV block 10/30/2020   Vitamin D  deficiency 10/30/2020   Morbid obesity (HCC) 10/30/2020   LVH (left ventricular hypertrophy) 02/15/2020   LAE (left atrial enlargement) 01/2020   Bradycardia 10/07/2012   Hyperlipidemia 10/07/2012   Vasovagal near-syncope, chronic 10/07/2012   Home Medication(s) Prior to Admission medications   Medication Sig Start Date End Date Taking? Authorizing Provider  predniSONE (STERAPRED UNI-PAK 21 TAB) 10 MG (21) TBPK tablet Take by mouth as directed. 12/08/23  Yes [provider]  aspirin EC 81 MG tablet Take 162 mg by mouth daily. Swallow whole. 09/19/20   Kuneff, Renee A, DO  atorvastatin  (LIPITOR) 80 MG tablet Take 1 tablet (80 mg total) by mouth at bedtime. 02/18/23   Kuneff, Renee A, DO  Cholecalciferol 25 MCG (1000 UT) capsule Take 1,000 Units by mouth daily. 10/07/12   [provider]  Coenzyme Q10 (CO Q-10) 100 MG CAPS Take 1 capsule by mouth daily. 09/19/20   Kuneff, Renee A, DO  Grape Seed Extract 100 MG CAPS Take 500 mg by mouth daily. 09/19/20   Kuneff, Renee A, DO  lisinopril  (ZESTRIL ) 5 MG tablet Take 1 tablet (5 mg total) by mouth daily. 08/08/23   Kuneff, Renee A, DO  Multiple Vitamin (MULTIVITAMIN) tablet Take 1 tablet by mouth daily. Unknown strenght    [provider]  Omega-3 Fatty Acids (FISH OIL) 645 MG CAPS Take 1 tablet by mouth daily. 09/19/20   Kuneff, Renee A, DO                                                                                                                                    Allergies Dust mite extract  Review of Systems Review of Systems As noted in  HPI  Physical Exam Vital Signs  I have reviewed the triage vital signs BP (!) 140/88   Pulse (!) 59   Temp 97.6 F (36.4 C) (Oral)   Resp 18   Wt 109.8 kg   SpO2 97%   BMI 34.73 kg/m   Physical Exam Vitals reviewed.  Constitutional:      General: He is not in acute distress.    Appearance: He is well-developed. He is not diaphoretic.  HENT:     Head: Normocephalic and atraumatic.     Nose: Nose normal.  Eyes:     General: No scleral icterus.       Right eye: No discharge.        Left eye: No discharge.     Conjunctiva/sclera: Conjunctivae normal.     Pupils: Pupils are equal, round, and reactive to light.  Cardiovascular:     Rate and Rhythm: Normal rate and regular rhythm.     Heart sounds: No murmur heard.    No friction rub. No gallop.  Pulmonary:     Effort: Pulmonary effort is normal. No respiratory distress.     Breath sounds: Normal breath sounds. No stridor. No rales.  Abdominal:     General: There is no distension.     Palpations: Abdomen is soft.     Tenderness: There is no abdominal tenderness.  Musculoskeletal:        General: No tenderness.     Cervical back: Normal range of motion and neck supple.  Skin:    General: Skin is warm and dry.     Findings: No erythema or rash.  Neurological:     Mental Status: He is alert and oriented to person, place, and time.     Comments: Mental Status:  Alert and oriented to person, place, and time.  Attention and concentration normal.  Speech clear.  Recent memory is intact  Cranial Nerves:  II Visual Fields: Intact to confrontation. Visual fields intact. III, IV, VI: Pupils equal and reactive to light and near. Full eye movement without nystagmus  V Facial Sensation: Normal. No weakness of masticatory muscles  VII: No facial weakness or asymmetry  VIII Auditory Acuity: Grossly normal  IX/X: The uvula is midline; the palate elevates symmetrically  XI: Normal sternocleidomastoid and trapezius strength  XII:  The tongue is midline. No atrophy or fasciculations.   Motor System: Muscle Strength: 5/5 and symmetric in the upper and lower extremities. No pronation or drift.  Muscle Tone: Tone and muscle bulk are normal in the upper and lower extremities.  Coordination: Intact finger-to-nose, heel-to-shin. No tremor.  Sensation: Intact to light touch Gait: deferred      ED Results and Treatments Labs (all labs ordered are listed, but only abnormal results are displayed) Labs Reviewed  BASIC METABOLIC PANEL WITH GFR - Abnormal; Notable for the following components:      Result Value   Glucose, Bld 143 (*)    Creatinine, Ser 1.33 (*)    All other components within normal limits  CBC  ETHANOL  PROTIME-INR  APTT  DIFFERENTIAL  TROPONIN I (HIGH SENSITIVITY)                                                                                                                         EKG  EKG Interpretation Date/Time:  Sunday February 08 2024 05:23:14 EDT Ventricular Rate:  57 PR Interval:  218 QRS Duration:  93 QT Interval:  399 QTC Calculation: 389 R Axis:   47  Text Interpretation: Sinus rhythm Prolonged PR interval Low voltage, precordial leads Anteroseptal infarct, old Borderline T abnormalities, inferior leads No significant change was found Confirmed by Trine Likes [phone]) on 02/08/2024 5:40:44 AM       Radiology DG Chest Port 1 View Result Date: 02/08/2024 EXAM: 1 VIEW XRAY OF THE CHEST 02/08/2024 05:56:25 AM COMPARISON: 2 view chest x-ray 11/07/2023. CLINICAL HISTORY: 355200 Chest pain [id number]. Pt in ambulatory POV with chest discomfort and R arm numbness/tingling, noticed when he woke up around 4am, went to bed normal at 0100. Pt states the numbness has improved some, speech clear and strength is equal. Pt states he took 6 baby aspirin PTA. FINDINGS: LUNGS AND PLEURA: Lung volumes are low. No focal pulmonary opacity. No pulmonary edema. No pleural effusion. No pneumothorax. HEART AND  MEDIASTINUM: No acute abnormality of the cardiac and mediastinal silhouettes. BONES AND SOFT TISSUES: No acute osseous abnormality. IMPRESSION: 1. No acute findings. 2. Low lung volumes. Electronically signed by: Lonni Necessary MD 02/08/2024 06:03 AM EDT RP Workstation: [id number]    Medications Ordered in ED Medications - No data to display Procedures Procedures  (including critical care time) Medical Decision Making / ED Course   Medical Decision Making Amount and/or Complexity of Data Reviewed Labs: ordered. Decision-making details documented in ED Course. Radiology: ordered and independent interpretation performed. Decision-making details documented in ED Course. ECG/medicine tests: ordered and independent interpretation performed. Decision-making details documented in ED Course.    Arm numbness Differential diagnosis considered. No focal deficits on exam concerning for ongoing CVA.  Possible TIA considered.  Also considering neuropraxia from sleeping on the extremities/position.  Will assess  for electrolyte derangements as well. Will get CT head to assess for any mass effect or ICH so have low suspicion for this. Teleneurology consulted.  Near syncope Differential diagnosis considered Patient with a history of repeated vasovagal syncope.  Patient has also had prior micturition syncope.  Favoring the same.  EKG without acute ischemic changes, dysrhythmias or blocks noted. Troponin negative. Delta trop pending No significant electrolyte derangements.  Mild renal insufficiency without AKI. No leukocytosis or anemia Chest x-ray without evidence of pneumonia, pneumothorax, pulmonary edema or pleural effusions.    Final Clinical Impression(s) / ED Diagnoses Final diagnoses:  None    This chart was dictated using voice recognition software.  Despite best efforts to proofread,  errors can occur which can change the documentation meaning.    Trine Raynell Moder,  MD 02/08/24 [phone]

## 2024-02-08 NOTE — Discharge Instructions (Addendum)
 It was a pleasure caring for you today in the emergency department.  We have started you on a medication called plavix , please take this as directed along with your baby aspirin. Please follow up with neurology in the office and cardiology in the office   Have someone stay with you until you feel stable. Do not drive, operate machinery, or play sports until your caregiver says it is okay. Keep all follow-up appointments as directed by your caregiver. Lie down right away if you start feeling like you might faint. Breathe deeply and steadily. Wait until all the symptoms have passed.Drink enough fluids to keep your urine clear or pale yellow. If you are taking blood pressure or heart medicine, get up slowly, taking several minutes to sit and then stand. This can reduce dizziness. SEEK IMMEDIATE MEDICAL CARE IF: You have a severe headache. You have unusual pain in the chest, abdomen, or back. You are bleeding from the mouth or rectum, or you have a black or tarry stool. You have an irregular or very fast heartbeat. You have pain with breathing. You have repeated fainting or seizure-like jerking during an episode. You faint when sitting or lying down. You have confusion. You have difficulty walking. You have severe weakness. You have vision problems. If you fainted, call your local emergency services - do not drive yourself to the hospital.   Please return to the emergency department immediately for any new or concerning symptoms, or if you get worse.

## 2024-02-08 NOTE — Consult Note (Signed)
 [facility] Tele[facility]urology Consult Services  Stat Consult  Patient Name:   Samuel Blackburn, Samuel Blackburn Date of Birth:   23-May-1966 Identification Number:   MRN - [id number] Date of Service:   02/08/2024 08:09:43  Diagnosis:       R55 - Syncope (blackout, fainting, vasovagal attack)       R29.818 - Transient neurological symptoms  Impression 58yoM hx of HTN and HLD woke up with RUE numbness and near-syncope episode during urination. Symptoms lasted approx without focal weakness, aphasia, or seizure-like activity. Current on exam NIHSS 0 and ambulate independently. Patient confirmed he felt back to baseline. CT head neg for acute finding. CBC and BMP unremarkable. DDx for near-syncope include micturition syncope, orthostatic hypotension, transient arrythmia, etc. DDx for RUE numbness includes TIA, CVA, compression neuropathy, peripheral neuropathy, etc. ABCD2 score 2.  Had an extensive discussion with patient regarding admission for TIA workup vs outpatient workup. Patient has an outpatient physical scheduled and expressed strong preference to have workup done outpatient through one source. He had agreed to starting DAPT (Plavix  75mg  + aspirin 81mg ) for 21 days for stroke prevention. He demonstrated understanding that if he developed new focal neurological symptoms or similar dizziness, he is to return to the hospital immediately for repeat evaluation and admission to facilitate the workup. Given patient has low ABCD2 score that indicated low stroke risk (3.1% in 90 days) and he had history of micturition syncope, agreed with outpatient workup.  Recommendation - DAPT (Plavix  75mg  + aspirin 81mg ) for 21 days for stroke prevention follow by aspirin 81mg  daily - F/U PCP upon DC for continued secondary stroke prevention  Workup needed outpatient - MR brain w/o, MRA head w/wo, MRA neck w/wo - Transthoracic Echocardiogram w/ bubble - Lipid panel (LDL < 70 goal) - Hgb a1c (goal < 7.0) -  Cardiology eval to discuss cardiac monitoring (i.e. Holter or loop recorder)   Recommendations: Our recommendations are outlined below.  Diagnostic Studies : Holter/Loop Recorder as outpatient with cardiology follow up  Dispositions : No further recommendations    ----------------------------------------------------------------------------------------------------    Metrics: Dispatch Time: 02/08/2024 08:04:38 Callback Response Time: 02/08/2024 08:10:42  Primary Provider Notified of Diagnostic Impression and Management Plan on: 02/08/2024 09:24:46    Imaging CTH IMPRESSION: 1. No acute intracranial abnormality. 2. Atrophy with chronic small vessel ischemic disease.   Labs WBC 6.7, Hgb 16, Plt 238 Na 138, Cr 1.33, Glu 143 PT 13.9, INR 1.0, aPTT 25   ----------------------------------------------------------------------------------------------------  Chief Complaint: syncope, transient right arm numbness  History of Present Illness: Patient is a 58 year old Male. Patient confirmed that he went to bed around 1AM at baseline. He woke up around 4AM and noticed immediately numbness of his right arm from shoulder distally to his finger. He went to restroom. While in the middle of urinating he suddenly start feeling dizzy, wobbly, as if he's about to pass out. He managed to to went back to sat down on the bed, woke up his wife, and lay down. He did not think he fully loss consciousness but felt it was very difficult for him to stay awake. Wife noticed his face was gray initially and regain color after he lay down. During this time patient said his right arm felt cool even though the arm itself wasn't cold. Paramedic was called and by the time EMS arrived patient was feeling better. EMS did a onsite EKG and did not find anything unusual. Wife confirmed patient did not have any obvious focal weakness, speech change,  seizure-like activity. The numbness lasted about . Patient  denies any weakness, speech difficulty, or other focal neurological deficit during this episode.  He has intermittent numbness on the right arm, he has been attribute it to sleeping on it wrong. Back in 1990s patient had an episode of passing out episode while urinating without association of numbness.  A few years ago he had cardiology appointment that was unremarkable.    Past Medical History:      Hypertension      Hyperlipidemia      There is no history of Diabetes Mellitus      There is no history of Stroke  Medications:  No Anticoagulant use  Antiplatelet use: Yes aspirin 81mg  x2 daily Reviewed EMR for current medications  Allergies:  Reviewed,NKDA  Social History: Smoking: No Alcohol Use: No  Family History:  There Is Family History Of: syncope There is no family history of premature cerebrovascular disease pertinent to this consultation  ROS : 14 Points Review of Systems was performed and was negative except mentioned in HPI.  Past Surgical History: There Is No Surgical History Contributory To Today's Visit   Examination: BP(141/81), Pulse(57),  Neuro Exam: General: Alert,Awake, Oriented to Time, Place, Person  Speech: Fluent:  Language: Intact:  Face: Symmetric:  Facial Sensation: Intact:  Visual Fields: Intact:  Extraocular Movements: Intact:  Motor Exam: No Drift:  Sensation: Intact:  Coordination: Intact:  ambulate independently   Spoke with : Dr. Elnor    This consult was conducted in real time using interactive audio and Immunologist. Patient was informed of the technology being used for this visit and agreed to proceed. Patient located in hospital and provider located at home/office setting.  Patient is being evaluated for possible acute neurologic impairment and high probability of imminent or life - threatening deterioration.I spent total of 80 minutes providing care to this patient, including time for face to face visit via  telemedicine, review of medical records, imaging studies and discussion of findings with providers, the patient and / or family.   Dr Shirlee Pam   [facility] For Inpatient follow-up with [facility] physician please call [facility] at [phone]. As we are not an outpatient service for any post hospital discharge needs please contact the hospital for assistance.  If you have any questions for the [facility] physicians or need to reconsult for clinical or diagnostic changes please contact us  via [facility] at [phone].

## 2024-02-08 NOTE — ED Notes (Signed)
 Patient transported to CT

## 2024-02-08 NOTE — ED Provider Notes (Signed)
 Provider Note MRN:  [id number]  Arrival date & time: 02/08/24    ED Course and Medical Decision Making  Assumed care from Dr Trine at shift change.  See note from prior team for complete details, in brief:  Clinical Course as of 02/08/24 1025  Sun Feb 08, 2024  0705 Handoff PEC 58 yo/m Htn, hld, vasovagal rpt Right arm numbness ~4am, lkn ~1a Went to bathroom - lightheaded/dizzy/near syncope; better when lied down Numbness ~5 mins, no weak CTH pend Tele neuro pend ([place] o/p) [SG]  9077 Spoke w/ teleneuro; pt does not want to stay for further eval, o/p w/u  3wks of plavix , asa F/u cardiology for syncope w/u, likely micturition syncope [SG]    Clinical Course User Index [SG] Elnor Jayson LABOR, DO   Eval by tele neuro was completed - see consult note -Possible TIA, outpatient neuro f/u -Syncope, recurrent - likely micturition, advised to stay hydrated, f/u cardiology, syncope precautions  Patient in no distress and overall condition is stable. Detailed discussions were had with the patient/guardian regarding current findings, and need for close f/u with PCP or on call doctor. The patient/guardian has been instructed to return immediately if the symptoms worsen in any way for re-evaluation. Patient/guardian verbalized understanding and is in agreement with current care plan. All questions answered prior to discharge.   Procedures  Final Clinical Impressions(s) / ED Diagnoses     ICD-10-CM   1. Arm numbness  R20.0 Ambulatory referral to Neurology    2. Syncope, unspecified syncope type  R55 Ambulatory referral to Cardiology      ED Discharge Orders          Ordered    Ambulatory referral to Neurology       Comments: An appointment is requested in approximately: 2 weeks   02/08/24 1023    Ambulatory referral to Cardiology        02/08/24 1023    clopidogrel  (PLAVIX ) 75 MG tablet  Daily        02/08/24 1023              Discharge Instructions      It was a  pleasure caring for you today in the emergency department.  We have started you on a medication called plavix , please take this as directed along with your baby aspirin. Please follow up with neurology in the office and cardiology in the office   Have someone stay with you until you feel stable. Do not drive, operate machinery, or play sports until your caregiver says it is okay. Keep all follow-up appointments as directed by your caregiver. Lie down right away if you start feeling like you might faint. Breathe deeply and steadily. Wait until all the symptoms have passed.Drink enough fluids to keep your urine clear or pale yellow. If you are taking blood pressure or heart medicine, get up slowly, taking several minutes to sit and then stand. This can reduce dizziness. SEEK IMMEDIATE MEDICAL CARE IF: You have a severe headache. You have unusual pain in the chest, abdomen, or back. You are bleeding from the mouth or rectum, or you have a black or tarry stool. You have an irregular or very fast heartbeat. You have pain with breathing. You have repeated fainting or seizure-like jerking during an episode. You faint when sitting or lying down. You have confusion. You have difficulty walking. You have severe weakness. You have vision problems. If you fainted, call your local emergency services - do not drive yourself to  the hospital.   Please return to the emergency department immediately for any new or concerning symptoms, or if you get worse.         Elnor Savant A, DO 02/08/24 1025

## 2024-02-09 ENCOUNTER — Encounter: Payer: Self-pay | Admitting: Neurology

## 2024-02-09 NOTE — Telephone Encounter (Signed)
 Pt declined doing surgical clearance at this time. He states he is not planning to do surgery this year will confirm again at physical prior to any further action with surgical forms.

## 2024-02-20 ENCOUNTER — Encounter: Payer: Self-pay | Admitting: Family Medicine

## 2024-02-20 ENCOUNTER — Ambulatory Visit (INDEPENDENT_AMBULATORY_CARE_PROVIDER_SITE_OTHER): Payer: 59 | Admitting: Family Medicine

## 2024-02-20 VITALS — BP 112/72 | HR 82 | Temp 98.2°F | Ht 71.0 in | Wt 244.4 lb

## 2024-02-20 DIAGNOSIS — Z125 Encounter for screening for malignant neoplasm of prostate: Secondary | ICD-10-CM

## 2024-02-20 DIAGNOSIS — E559 Vitamin D deficiency, unspecified: Secondary | ICD-10-CM

## 2024-02-20 DIAGNOSIS — R931 Abnormal findings on diagnostic imaging of heart and coronary circulation: Secondary | ICD-10-CM

## 2024-02-20 DIAGNOSIS — I10 Essential (primary) hypertension: Secondary | ICD-10-CM

## 2024-02-20 DIAGNOSIS — Z0000 Encounter for general adult medical examination without abnormal findings: Secondary | ICD-10-CM | POA: Diagnosis not present

## 2024-02-20 DIAGNOSIS — I517 Cardiomegaly: Secondary | ICD-10-CM

## 2024-02-20 DIAGNOSIS — E782 Mixed hyperlipidemia: Secondary | ICD-10-CM

## 2024-02-20 DIAGNOSIS — R55 Syncope and collapse: Secondary | ICD-10-CM | POA: Insufficient documentation

## 2024-02-20 MED ORDER — ATORVASTATIN CALCIUM 80 MG PO TABS: 80.0000 mg | ORAL_TABLET | Freq: Every day | ORAL | 11 refills | Status: AC

## 2024-02-20 MED ORDER — LISINOPRIL 5 MG PO TABS: 5.0000 mg | ORAL_TABLET | Freq: Every day | ORAL | 5 refills | Status: DC

## 2024-02-20 NOTE — Patient Instructions (Signed)

## 2024-02-20 NOTE — Progress Notes (Signed)
 Patient ID: Samuel Blackburn, male  DOB: January 25, 1966, 58 y.o.   MRN: [id number] Patient Care Team    Relationship Specialty Notifications Start End  Catherine Charlies LABOR, DO PCP - General Family Medicine  10/30/20   Bernie Lamar PARAS, MD Consulting Physician Cardiology  08/24/21   Avram Lupita BRAVO, MD Consulting Physician Gastroenterology  02/20/24     Chief Complaint  Patient presents with   Annual Exam    Chronic Conditions/illness Management Pt is fasting    Subjective: Samuel Blackburn is a 58 y.o. male present for Cpe and Chronic Conditions/illness Management  All past medical history, surgical history, allergies, family history, immunizations, medications and social history were updated in the electronic medical record today. All recent labs, ED visits and hospitalizations within the last year were reviewed.  Health maintenance:  Colonoscopy: fhx colon cancer in father. last screen 09/16/2023- Dr.Gessner- 5 yr rpt Immunizations:  tdap UTD 2023, influenza (recommend yearly), pna20 completed. zostavax series completed. Infectious disease screening: HIV and Hep C completed.  PSA:  Lab Results  Component Value Date   PSA 1.12 02/14/2023   PSA 1.02 02/08/2022   PSA 1.01 02/04/2020   , pt was counseled on prostate cancer screenings.  Patient has a Dental home. Hospitalizations/ED visits: reviewed  Hypertension/HLD Pt reports compliance with lisinopril  5 mg qd,  and Lipitor 80 mg daily- he states he did not increase to 80 mg. Patient denies chest pain, shortness of breath, dizziness or lower extremity edema.  Had been seen in ED x2 last few months with syncope and chest pain. Work up reassuring. There was some concern over him having a TIA- he was prescribed plavix  3 weeks, but he reports he is not taking it (yet). Pt takes a daily baby ASA. Pt is  prescribed Lipitor 80 mg in the evening.  This was increased last visit.      02/20/2024    1:51 PM 02/14/2023    1:20 PM  07/26/2022    1:14 PM 02/08/2022    1:10 PM 10/30/2020   10:26 AM  Depression screen PHQ 2/9  Decreased Interest 0 1 0 1 0  Down, Depressed, Hopeless 0 2 0 1 0  PHQ - 2 Score 0 3 0 2 0  Altered sleeping  2     Tired, decreased energy  2     Change in appetite  0     Feeling bad or failure about yourself   0     Trouble concentrating  2     Moving slowly or fidgety/restless  0     PHQ-9 Score  9     Difficult doing work/chores  Somewhat difficult         02/14/2023    1:21 PM  GAD 7 : Generalized Anxiety Score  Nervous, Anxious, on Edge 0  Control/stop worrying 2  Worry too much - different things 1  Trouble relaxing 3  Restless 1  Easily annoyed or irritable 2  Afraid - awful might happen 1  Total GAD 7 Score 10          02/20/2024    1:51 PM 02/14/2023    1:19 PM 10/30/2020   10:36 AM  Fall Risk   Falls in the past year? 0 0 0  Number falls in past yr: 0 0 0  Injury with Fall? 0 0 0  Risk for fall due to : No Fall Risks    Follow up Falls  evaluation completed Falls evaluation completed Falls evaluation completed      Data saved with a previous flowsheet row definition    Immunization History  Administered Date(s) Administered   Influenza, Seasonal, Injecte, Preservative Fre 08/08/2023   Influenza,inj,Quad PF,6+ Mos 08/24/2021, 07/26/2022   Influenza-Unspecified 05/27/2020   PFIZER(Purple Top)SARS-COV-2 Vaccination 10/18/2019, 11/15/2019, 06/29/2020   PNEUMOCOCCAL CONJUGATE-20 01/16/2021   Tdap 02/08/2022   Zoster Recombinant(Shingrix ) 02/08/2022, 07/26/2022    Past Medical History:  Diagnosis Date   Allergies    Blood per rectum 02/14/2023   Bruit    Cervical radiculopathy    Chest pain 10/07/2012   Chicken pox    COVID-19 04/2023   Depression    Dyspnea on exertion 01/16/2021   Erectile dysfunction    Fatty pancreas    Hepatic steatosis    History of fainting spells of unknown cause    Hyperlipemia    Hypertension    IGT (impaired glucose  tolerance)    LAE (left atrial enlargement) 01/2020   Markedly dilated    LVH (left ventricular hypertrophy) 02/15/2020   Mild concentric LVH   Migraines    Mitral regurgitation    Polycythemia    Had hematology work-up and condition is benign   Rectal bleeding    Splenomegaly    Mild    Stage 3 chronic kidney disease (HCC)    Vitamin D  deficiency    Allergies  Allergen Reactions   Dust Mite Extract Other (See Comments)    Pollen, dust, and wire- EYES BURN & tired per pt   Past Surgical History:  Procedure Laterality Date   COLONOSCOPY  20118   WISDOM TOOTH EXTRACTION     years ago   Family History  Problem Relation Age of Onset   Stroke Mother    Early death Mother    Depression Mother    Anxiety disorder Mother    Diabetes Father    Heart disease Father    Hyperlipidemia Father    Colon cancer Father    Healthy Brother    Depression Maternal Grandmother    Miscarriages / Stillbirths Maternal Grandmother    Hyperlipidemia Maternal Grandfather    Hypertension Maternal Grandfather    Diabetes Paternal Grandmother    COPD Paternal Grandfather    Hearing loss Paternal Grandfather    Heart disease Paternal Grandfather    Healthy Daughter    Healthy Son    Rectal cancer Neg Hx    Stomach cancer Neg Hx    Esophageal cancer Neg Hx    Social History   Social History Narrative   Marital status/children/pets: Married   Education/employment: Oncologist, works as an Manufacturing systems engineer.   Safety:      -smoke alarm in the home:Yes     - wears seatbelt: Yes    Allergies as of 02/20/2024       Reactions   Dust Mite Extract Other (See Comments)   Pollen, dust, and wire- EYES BURN & tired per pt        Medication List        Accurate as of February 20, 2024  2:28 PM. If you have any questions, ask your nurse or doctor.          STOP taking these medications    Grape Seed Extract 100 MG Caps Stopped by: Kha Hari   predniSONE 10 MG (21) Tbpk  tablet Commonly known as: STERAPRED UNI-PAK 21 TAB Stopped by: Charlies Bellini       TAKE  these medications    aspirin EC 81 MG tablet Take 162 mg by mouth daily. Swallow whole.   atorvastatin  80 MG tablet Commonly known as: LIPITOR Take 1 tablet (80 mg total) by mouth at bedtime.   Cholecalciferol 25 MCG (1000 UT) capsule Take 1,000 Units by mouth daily.   clopidogrel  75 MG tablet Commonly known as: PLAVIX  Take 1 tablet (75 mg total) by mouth daily for 21 days.   Co Q-10 100 MG Caps Take 1 capsule by mouth daily.   Fish Oil 645 MG Caps Take 1 tablet by mouth daily.   lisinopril  5 MG tablet Commonly known as: Zestril  Take 1 tablet (5 mg total) by mouth daily.   Magnesium 100 MG Tabs Take 1 tablet by mouth daily.   multivitamin tablet Take 1 tablet by mouth daily. Unknown strenght       All past medical history, surgical history, allergies, family history, immunizations andmedications were updated in the EMR today and reviewed under the history and medication portions of their EMR.     CT head 02/08/2024: IMPRESSION: 1. No acute intracranial abnormality. 2. Atrophy with chronic small vessel ischemic disease.  Result Date: 12/15/2020 CT CARDIAC SCORING (SELF PAY ONLY)  IMPRESSION: Coronary calcium  score of 22.3 isolated to circumflex . This was 1 th percentile for age-, race-, and sex-matched controls.  FINDINGS: Within the visualized portions of the thorax there are no suspicious appearing pulmonary nodules or masses, there is no acute consolidative airspace disease, no pleural effusions, no pneumothorax and no lymphadenopathy. Visualized portions of the upper abdomen are unremarkable. There are no aggressive appearing lytic or blastic lesions noted in the visualized portions of the skeleton.  ROS 14 pt review of systems performed and negative (unless mentioned in an HPI)  Objective: BP 112/72   Pulse 82   Temp 98.2 F (36.8 C)   Ht 5' 11 (1.803 m)   Wt 244  lb 6.4 oz (110.9 kg)   SpO2 95%   BMI 34.09 kg/m  Physical Exam Vitals and nursing note reviewed. Exam conducted with a chaperone present.  Constitutional:      General: He is not in acute distress.    Appearance: Normal appearance. He is not ill-appearing, toxic-appearing or diaphoretic.  HENT:     Head: Normocephalic and atraumatic.     Right Ear: Tympanic membrane, ear canal and external ear normal. There is no impacted cerumen.     Left Ear: Tympanic membrane, ear canal and external ear normal. There is no impacted cerumen.     Nose: Nose normal. No congestion or rhinorrhea.     Mouth/Throat:     Mouth: Mucous membranes are moist.     Pharynx: Oropharynx is clear. No oropharyngeal exudate or posterior oropharyngeal erythema.  Eyes:     General: No scleral icterus.       Right eye: No discharge.        Left eye: No discharge.     Extraocular Movements: Extraocular movements intact.     Pupils: Pupils are equal, round, and reactive to light.  Cardiovascular:     Rate and Rhythm: Normal rate and regular rhythm.     Pulses: Normal pulses.     Heart sounds: Normal heart sounds. No murmur heard.    No friction rub. No gallop.  Pulmonary:     Effort: Pulmonary effort is normal. No respiratory distress.     Breath sounds: Normal breath sounds. No stridor. No wheezing, rhonchi or rales.  Chest:  Chest wall: No tenderness.  Abdominal:     General: Abdomen is flat. Bowel sounds are normal. There is no distension.     Palpations: Abdomen is soft. There is no mass.     Tenderness: There is no abdominal tenderness. There is no right CVA tenderness, left CVA tenderness, guarding or rebound.     Hernia: No hernia is present.  Musculoskeletal:        General: No swelling or tenderness. Normal range of motion.     Cervical back: Normal range of motion and neck supple.     Right lower leg: No edema.     Left lower leg: No edema.  Lymphadenopathy:     Cervical: No cervical adenopathy.   Skin:    General: Skin is warm and dry.     Coloration: Skin is not jaundiced.     Findings: No bruising, lesion or rash.  Neurological:     General: No focal deficit present.     Mental Status: He is alert and oriented to person, place, and time. Mental status is at baseline.     Cranial Nerves: No cranial nerve deficit.     Sensory: No sensory deficit.     Motor: No weakness.     Coordination: Coordination normal.     Gait: Gait normal.     Deep Tendon Reflexes: Reflexes normal.  Psychiatric:        Mood and Affect: Mood normal.        Behavior: Behavior normal.        Thought Content: Thought content normal.        Judgment: Judgment normal.     No results found.  Assessment/plan: IMRE VECCHIONE is a 58 y.o. male present for CPE and Chronic Conditions/illness Management Essential hypertension/HLD/calcification of coronary artery/ckd 3A/[place]  7. LVH (left ventricular hypertrophy)coronary artery calcium  score less than 100 (22.3-64th%)/Morbid obesity (HCC)/ stable [place] coronary artery calcium : 22.3 (64th%) continue lisinopril   5 daily Continue atorvastatin  80 Exercise is good. Labs - reviewed recent CBC and CMP  Prostate cancer screening - PSA  Routine general medical examination at a health care facility (Primary) Patient was encouraged to exercise greater than 150 minutes a week. Patient was encouraged to choose a diet filled with fresh fruits and vegetables, and lean meats. AVS provided to patient today for education/recommendation on gender specific health and safety maintenance. Colonoscopy: fhx colon cancer in father. last screen 09/16/2023- Dr.Gessner- 5 yr rpt Immunizations:  tdap UTD 2023, influenza (recommend yearly), pna20 completed. zostavax series completed. Infectious disease screening: HIV and Hep C completed   Return in about 6 months (around 08/09/2024) for Routine chronic condition follow-up.  Orders Placed This Encounter  Procedures    Hemoglobin A1c   Lipid panel   PSA   TSH   Meds ordered this encounter  Medications   atorvastatin  (LIPITOR) 80 MG tablet    Sig: Take 1 tablet (80 mg total) by mouth at bedtime.    Dispense:  30 tablet    Refill:  11   lisinopril  (ZESTRIL ) 5 MG tablet    Sig: Take 1 tablet (5 mg total) by mouth daily.    Dispense:  30 tablet    Refill:  5   Referral Orders  No referral(s) requested today     Note is dictated utilizing voice recognition software. Although note has been proof read prior to signing, occasional typographical errors still can be missed. If any questions arise, please do not hesitate to call  for verification.  Electronically signed by: Charlies Bellini, DO [facility] [place]

## 2024-02-21 LAB — LIPID PANEL
Cholesterol: 162 mg/dL (ref <200)
HDL: 34 mg/dL — ABNORMAL LOW (ref >=40)
LDL Cholesterol (Calc): 103 mg/dL — ABNORMAL HIGH
Non-HDL Cholesterol (Calc): 128 mg/dL (ref <130)
Total CHOL/HDL Ratio: 4.8 (calc) (ref <5.0)
Triglycerides: 145 mg/dL (ref <150)

## 2024-02-21 LAB — HEMOGLOBIN A1C
Hgb A1c MFr Bld: 6.6 % — ABNORMAL HIGH (ref <5.7)
Mean Plasma Glucose: 143 mg/dL
eAG (mmol/L): 7.9 mmol/L

## 2024-02-21 LAB — TSH: TSH: 1.47 m[IU]/L (ref 0.40–4.50)

## 2024-02-21 LAB — PSA: PSA: 1.04 ng/mL (ref <=4.00)

## 2024-02-23 ENCOUNTER — Ambulatory Visit: Payer: Self-pay | Admitting: Family Medicine

## 2024-02-23 NOTE — Telephone Encounter (Signed)
 Please call patient concerning his labs His A1c is elevated to 6.6, this is technically in the diabetic range that starts at 6.5.  Recommendations are for him to obtain routine cardiovascular exercise of at least 30 minutes/day.  Follow a low glycemic/sugar diet.    - Typically once the A1c reaches diabetic range -a medication called metformin is started.  Metformin assists the glucose uptake in muscles and reduces the glucose production of the liver. His LDL/bad cholesterol is currently 103 and his HDL/good cholesterol was 34.  Continue atorvastatin  80 mg daily.  He would benefit from increasing his HDL.  He could bring this up with eating foods such as olive oil, salmon, whole grains, beans/legumes and exercise. Thyroid  level and prostate cancer screening levels are normal.   If he agrees to start metformin low-dose daily, I will call that in today. Recommend he work on the exercise and dietary changes mentioned above and follow-up in 4 months for retesting.

## 2024-03-26 ENCOUNTER — Telehealth: Payer: Self-pay

## 2024-03-26 NOTE — Telephone Encounter (Signed)
 Communication  Reason for CRM: patient was supposed to be place on metformin 2 months ago - July. He has not received updates regarding this and would like for the nurse to give him a call regarding questions and concerns. [phone]  LVM to discuss w pt.

## 2024-04-06 ENCOUNTER — Ambulatory Visit: Admitting: Neurology

## 2024-05-05 ENCOUNTER — Ambulatory Visit: Payer: Self-pay | Admitting: Family Medicine

## 2024-05-05 NOTE — Telephone Encounter (Signed)
 FYI Only or Action Required?: FYI only for provider.  Patient was last seen in primary care on 02/20/2024 by Catherine Fuller A, DO.  Called Nurse Triage reporting Cyst.  Symptoms began yesterday.  Interventions attempted: Other: Massage.  Triage Disposition: See Physician Within 24 Hours  Patient/caregiver understands and will follow disposition?: Yes            Copied from CRM #[id number]. Topic: Clinical - Red Word Triage >> May 05, 2024 11:13 AM Rea BROCKS wrote: Red Word that prompted transfer to Nurse Triage: Knot behind ear  Left side jaw locked up, doesn't know if it's a lymph node or not. There's some swelling, when trying to close jaw it is very uncomfortable. Pain when swallowing and fully try to close jaw. Reason for Disposition  [1] Swelling is painful to touch AND [2] no fever  Answer Assessment - Initial Assessment Questions This RN recommends pt goes to urgent care today as no appointment availability in office until Fri. Pt agreeable.  Lump below left ear noticed last night Size: difficult to say as its behind jaw, maybe an inch Muscle around left jaw was tight and pt loosened it through massage Discomfort in this area: with mouth open 1-2/10 pain level, mouth closed 5-6/10 pain level When eating eggs this morning pt noticed it was uncomfortable when trying to swallow Pt states it almost feels like a muscle cramp Denies difficulty breathing, fever  Protocols used: Skin Lump or Localized Swelling-A-AH

## 2024-05-18 ENCOUNTER — Ambulatory Visit

## 2024-05-18 ENCOUNTER — Other Ambulatory Visit: Payer: Self-pay

## 2024-05-18 ENCOUNTER — Ambulatory Visit (INDEPENDENT_AMBULATORY_CARE_PROVIDER_SITE_OTHER): Admitting: Family Medicine

## 2024-05-18 VITALS — BP 114/78 | HR 71 | Ht 71.0 in | Wt 247.0 lb

## 2024-05-18 DIAGNOSIS — M25552 Pain in left hip: Secondary | ICD-10-CM

## 2024-05-18 DIAGNOSIS — M25551 Pain in right hip: Secondary | ICD-10-CM

## 2024-05-18 NOTE — Progress Notes (Signed)
   LILLETTE Ileana Collet, PhD, LAT, ATC acting as a scribe for Artist Lloyd, MD.  TURRELL SEVERT is a 58 y.o. male who presents to [facility] today for bilat hip pain. A few years ago he started running for the 1st time. He was doing well and increasing his distance, up to being able to race a 5K. Pt locates pain to L hip- anterior and into the groin. R hip- groin, anterior, posteriorly w/ radiating pain along the posterior thigh.  He is wondering if his pain is related to the atorvastatin .  Radiates: yes Aggravates: prolonged walking, R-sided figure-4 Treatments tried: PT @ [facility], IM injection @ [facility], massage, shoe lift, cupping, stretching  Pertinent review of systems: no fever or chills  Relevant historical information: Hyperlipidemia on atorvastatin    Exam:  BP 114/78   Pulse 71   Ht 5' 11 (1.803 m)   Wt 247 lb (112 kg)   SpO2 97%   BMI 34.45 kg/m  General: Well Developed, well nourished, and in no acute distress.   MSK: Left hip decreased range of motion.  Pain with rotation.  Strength is intact  Right hip normal-appearing tender palpation lateral hip at greater trochanter.  Hip range of motion is reduced.  Strength is intact.    Lab and Radiology Results  X-ray images left hip obtained today personally and independently interpreted. Severe left hip DJD.  Mild right hip DJD. Await formal radiology review    Assessment and Plan: 58 y.o. male with bilateral hip pain left worse than right.  Left hip pain predominantly anterior due to severe DJD.  He is already completed a course of physical therapy which has improved his symptoms a bit but not quite enough.  Additionally he has right lateral hip pain.  This has improved a bit with physical therapy.  It is possible that he is overloading his right hip and hip musculature due to left hip arthritis.  Plan for surgical consultation.  Happy to proceed with injections if needed.    Atorvastatin : Patient is concerned that atorvastatin  is causing his pain.  Possibly could be related.  We discussed options.  Plan to discontinue atorvastatin  for few weeks and then restart it.  If his pain goes away and returns we can be pretty sure that the atorvastatin  is a least partially responsible for his pain.   PDMP not reviewed this encounter. Orders Placed This Encounter  Procedures   DG HIP UNILAT W OR W/O PELVIS 2-3 VIEWS LEFT    Standing Status:   Future    Number of Occurrences:   1    Expiration Date:   06/18/2024    Reason for Exam (SYMPTOM  OR DIAGNOSIS REQUIRED):   left hip pain    Preferred imaging location?:   [facility]   No orders of the defined types were placed in this encounter.    Discussed warning signs or symptoms. Please see discharge instructions. Patient expresses understanding.   The above documentation has been reviewed and is accurate and complete Artist Lloyd, M.D.

## 2024-05-18 NOTE — Patient Instructions (Addendum)
 Thank you for coming in today.   Please get an Xray today before you leave   Try stopping the atorvastatin  and see if your pain changes. Then re-start  Let see what the xray showed and based on finding, go from there

## 2024-05-20 ENCOUNTER — Ambulatory Visit: Payer: Self-pay | Admitting: Family Medicine

## 2024-05-20 NOTE — Progress Notes (Signed)
 Left hip x-ray shows severe arthritis.  You also have medium arthritis of the right hip.

## 2024-06-01 ENCOUNTER — Ambulatory Visit (INDEPENDENT_AMBULATORY_CARE_PROVIDER_SITE_OTHER): Admitting: Neurology

## 2024-06-01 ENCOUNTER — Encounter: Payer: Self-pay | Admitting: Neurology

## 2024-06-01 VITALS — BP 137/92 | HR 93 | Ht 71.0 in | Wt 252.0 lb

## 2024-06-01 DIAGNOSIS — R55 Syncope and collapse: Secondary | ICD-10-CM

## 2024-06-01 DIAGNOSIS — R200 Anesthesia of skin: Secondary | ICD-10-CM | POA: Diagnosis not present

## 2024-06-01 NOTE — Progress Notes (Signed)
 [facility] Clinic Note - Initial Visit   Date: 06/01/2024   Samuel Blackburn MRN: [id number] DOB: October 23, 1965   Dear Dr. Catherine:  Thank you for your kind referral of [name] for consultation of right arm numbness. Although his history is well known to you, please allow us  to reiterate it for the purpose of our medical record. The patient was accompanied to the clinic by self.    Samuel Blackburn is a 58 y.o. right-handed male with hypertension, hyperlipidemia, and CKD presenting for evaluation of right arm numbness.   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 Unlikely TIA, as symptoms lasted for 5 minutes and did not have involving of the right face or leg.  If symptoms return, NCS/EMG can be ordered.  Continue to monitor for now.   Near syncope in the setting of micturition.  He has history of recurrent syncope in the past.  Follow-up with cardiology.   Return to clinic as needed  ------------------------------------------------------------- History of present illness: On 02/08/2024, he was evaluated in the ER after waking up with right arm numbness and lightheadedness while voiding.  He walked back to his bed where his wife noticed that he was pale and he passed out for about 15 seconds.   Wife called EMS who transferred him to the ER.  He reports numbness of the arm lasted about 5 minutes and then resolved.  There was no associated weakness, numbness of the face or leg.   Due to concern of possible TIA, he was recommended to start aspirin and plavix  x 3 weeks, as he was already on aspirin.  He reports taking plavix  alone for 3 weeks, and now back on aspirin daily.   Since this time, he has not had any further spells of right arm weakness.   He has strong family history of syncope in the setting of stressful events.  He has history of low blood pressure and had prior syncopal spells.    Nonsmoker.  He does not drink  alcohol.  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  He lives at home with wife.    Out-side paper records, electronic medical record, and images have been reviewed where available and summarized as:  CT head wo contrast 02/08/2024: 1. No acute intracranial abnormality. 2. Atrophy with chronic small vessel ischemic disease.  Lab Results  Component Value Date   HGBA1C 6.6 (H) 02/20/2024   Lab Results  Component Value Date   TSH 1.47 02/20/2024   Lab Results  Component Value Date   ESRSEDRATE 2 03/18/2014    Past Medical History:  Diagnosis Date   Allergies    Blood per rectum 02/14/2023   Bruit    Cervical radiculopathy    Chest pain 10/07/2012   Chicken pox    COVID-19 04/2023   Depression    Dyspnea on exertion 01/16/2021   Erectile dysfunction    Fatty pancreas    Hepatic steatosis    History of fainting spells of unknown cause    Hyperlipemia    Hypertension    IGT (impaired glucose tolerance)    LAE (left atrial enlargement) 01/2020   Markedly dilated    LVH (left ventricular hypertrophy) 02/15/2020   Mild concentric LVH   Migraines    Mitral regurgitation    Polycythemia    Had hematology work-up and condition is benign   Rectal bleeding    Splenomegaly    Mild    Stage 3 chronic kidney  disease [facility])    Vitamin D  deficiency     Past Surgical History:  Procedure Laterality Date   COLONOSCOPY  20118   WISDOM TOOTH EXTRACTION     years ago     Medications:  Outpatient Encounter Medications as of 06/01/2024  Medication Sig   aspirin EC 81 MG tablet Take 162 mg by mouth daily. Swallow whole.   atorvastatin  (LIPITOR) 80 MG tablet Take 1 tablet (80 mg total) by mouth at bedtime. (Patient taking differently: Take 40 mg by mouth at bedtime.)   Cholecalciferol 25 MCG (1000 UT) capsule Take 1,000 Units by mouth daily.   Coenzyme Q10 (CO Q-10) 100 MG CAPS Take 1 capsule by mouth daily.   lisinopril  (ZESTRIL ) 5 MG tablet Take 1 tablet (5 mg total) by  mouth daily.   Magnesium 100 MG TABS Take 1 tablet by mouth daily.   Multiple Vitamin (MULTIVITAMIN) tablet Take 1 tablet by mouth daily. Unknown strenght (Patient taking differently: Take 1 tablet by mouth daily. Take once to twice a week)   Omega-3 Fatty Acids (FISH OIL) 645 MG CAPS Take 1 tablet by mouth daily.   No facility-administered encounter medications on file as of 06/01/2024.    Allergies:  Allergies  Allergen Reactions   Dust Mite Extract Other (See Comments)    Pollen, dust, and wire- EYES BURN & tired per pt    Family History: Family History  Problem Relation Age of Onset   Stroke Mother    Early death Mother    Depression Mother    Anxiety disorder Mother    Rheumatic fever Mother    Diabetes Father    Heart disease Father    Hyperlipidemia Father    Colon cancer Father    Healthy Brother    Diabetes Paternal Aunt    Depression Maternal Grandmother    Miscarriages / Stillbirths Maternal Grandmother    Hyperlipidemia Maternal Grandfather    Hypertension Maternal Grandfather    Diabetes Paternal Grandmother    COPD Paternal Grandfather    Hearing loss Paternal Grandfather    Heart disease Paternal Grandfather    Iron deficiency Paternal Actor    Healthy Daughter    Healthy Son    Rectal cancer Neg Hx    Stomach cancer Neg Hx    Esophageal cancer Neg Hx     Social History: Social History   Tobacco Use   Smoking status: Never   Smokeless tobacco: Never  Vaping Use   Vaping status: Never Used  Substance Use Topics   Alcohol use: Not Currently   Drug use: Not Currently   Social History   Social History Narrative   Marital status/children/pets: Married   Education/employment: Oncologist, works as an manufacturing systems engineer.   Safety:      -smoke alarm in the home:Yes     - wears seatbelt: Yes      Are you right handed or left handed? Right Handed    Are you currently employed ? 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 No    Who lives with you? Randine    What type of home do you live in: 1 story or 2 story? Lives in a one story home. Stairs leading up to front entrance         Vital Signs:  BP (!) 137/92   Pulse 93   Ht 5' 11 (1.803 m)   Wt 252  lb (114.3 kg)   SpO2 96%   BMI 35.15 kg/m    Neurological Exam: MENTAL STATUS including orientation to time, place, person, recent and remote memory, attention span and concentration, language, and fund of knowledge is normal.  Speech is not dysarthric.  CRANIAL NERVES: II:  No visual field defects.     III-IV-VI: Pupils equal round and reactive to light.  Normal conjugate, extra-ocular eye movements in all directions of gaze.  No nystagmus.  No ptosis.   V:  Normal facial sensation.    VII:  Normal facial symmetry and movements.   VIII:  Normal hearing and vestibular function.   IX-X:  Normal palatal movement.   XI:  Normal shoulder shrug and head rotation.   XII:  Normal tongue strength and range of motion, no deviation or fasciculation.  MOTOR:  Motor strength is 5/5 throughout. No atrophy, fasciculations or abnormal movements.  No pronator drift.   MSRs:                                           Right        Left brachioradialis 2+  2+  biceps 2+  2+  triceps 2+  2+  patellar 2+  2+  ankle jerk 2+  2+  plantar response down  down   SENSORY:  Normal and symmetric perception of light touch, temperature, and vibration.  COORDINATION/GAIT: Normal finger-to- nose-finger.  Intact rapid alternating movements bilaterally.  Gait mildly antalgic due to left hip pain, stable and unassisted.     Thank you for allowing me to participate in patient's care.  If I can answer any additional questions, I would be pleased to do so.    Sincerely,    Alexandra Posadas K. Tobie, DO

## 2024-06-01 NOTE — Patient Instructions (Signed)
 Follow-up with cardiology for passing out spell (syncope)

## 2024-07-07 ENCOUNTER — Encounter: Payer: Self-pay | Admitting: Orthopaedic Surgery

## 2024-07-07 ENCOUNTER — Ambulatory Visit: Admitting: Orthopaedic Surgery

## 2024-07-07 VITALS — Ht 71.0 in | Wt 252.0 lb

## 2024-07-07 DIAGNOSIS — M1612 Unilateral primary osteoarthritis, left hip: Secondary | ICD-10-CM

## 2024-07-07 NOTE — Progress Notes (Signed)
 The patient is very pleasant 58 year old gentleman who I am seeing for the first time.  He is here with his wife today.  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  He does have some right hip issues as well but is not as severe.  That has been more recent for him.  He sent to me from Dr. Artist Lloyd who has seen him for this hip in the past.  He has had extensive physical therapy with integrative therapies for his hips and that is plateaued but it did help him regain a lot of his mobility for a while.  He would like to hike more often with his son especially when his son graduates from [employer] the spring.  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  He has not on any blood thinning medications.  I did review all of his past medical history and medications with in epic.  He is not a diabetic.  He is an active 58 year old gentleman and wants to be more active.  He would like to walk a lot and hike more.  At this point though his hip pain is detriment affecting his mobility, his quality of life and his actives of daily living.  He is here today to discuss hip replacement surgery.  His BMI is 35.15.  On exam his left hip has significant stiffness with internal and external rotation and limitations with rotation as well as pain in the groin with rotation.  His right hip has some pain in the groin with rotation but no stiffness with better rotation overall.  He does walk with a significant limp and a Trendelenburg gait.  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  There is complete loss of the superolateral joint space which is bone-on-bone.  There are sclerotic changes and large para-articular osteophytes around the left hip.  The right hip does show superior lateral narrowing and flattening of the femoral head and evidence of previous femoral acetabular impingement.  We had a long and  thorough discussion about hip replacement surgery.  I went over his x-rays with him as well as a hip replacement model.  We discussed the risk and benefits of surgery and what to expect from an intraoperative and postoperative standpoint.  I gave him a hand about hip replacement surgery as well and again we went over his x-rays in detail.  All questions and concerns were answered and addressed.  I did give him our surgery scheduler's card and he is welcome to reach out to us  and call or send us  a MyChart message if he decides to have this scheduled or needs other questions and concerns answered and addressed.

## 2024-07-26 ENCOUNTER — Emergency Department (HOSPITAL_COMMUNITY)
Admission: EM | Admit: 2024-07-26 | Discharge: 2024-07-26 | Discharge status: Against Medical Advice | Attending: Emergency Medicine | Admitting: Emergency Medicine

## 2024-07-26 ENCOUNTER — Other Ambulatory Visit: Payer: Self-pay

## 2024-07-26 ENCOUNTER — Emergency Department (HOSPITAL_COMMUNITY)

## 2024-07-26 DIAGNOSIS — Z5321 Procedure and treatment not carried out due to patient leaving prior to being seen by health care provider: Secondary | ICD-10-CM | POA: Diagnosis not present

## 2024-07-26 DIAGNOSIS — R42 Dizziness and giddiness: Secondary | ICD-10-CM | POA: Insufficient documentation

## 2024-07-26 DIAGNOSIS — R0789 Other chest pain: Secondary | ICD-10-CM | POA: Diagnosis present

## 2024-07-26 DIAGNOSIS — R0602 Shortness of breath: Secondary | ICD-10-CM | POA: Diagnosis not present

## 2024-07-26 DIAGNOSIS — R110 Nausea: Secondary | ICD-10-CM | POA: Diagnosis not present

## 2024-07-26 LAB — CBC WITH DIFFERENTIAL/PLATELET
Abs Immature Granulocytes: 0.03 K/uL (ref 0.00–0.07)
Basophils Absolute: 0.1 K/uL (ref 0.0–0.1)
Basophils Relative: 1 %
Eosinophils Absolute: 0.2 K/uL (ref 0.0–0.5)
Eosinophils Relative: 4 %
HCT: 48.6 % (ref 39.0–52.0)
Hemoglobin: 16.3 g/dL (ref 13.0–17.0)
Immature Granulocytes: 1 %
Lymphocytes Relative: 39 %
Lymphs Abs: 2.6 K/uL (ref 0.7–4.0)
MCH: 30.7 pg (ref 26.0–34.0)
MCHC: 33.5 g/dL (ref 30.0–36.0)
MCV: 91.5 fL (ref 80.0–100.0)
Monocytes Absolute: 0.5 K/uL (ref 0.1–1.0)
Monocytes Relative: 7 %
Neutro Abs: 3.2 K/uL (ref 1.7–7.7)
Neutrophils Relative %: 48 %
Platelets: 261 K/uL (ref 150–400)
RBC: 5.31 MIL/uL (ref 4.22–5.81)
RDW: 12.5 % (ref 11.5–15.5)
WBC: 6.6 K/uL (ref 4.0–10.5)
nRBC: 0 % (ref 0.0–0.2)

## 2024-07-26 LAB — COMPREHENSIVE METABOLIC PANEL WITH GFR
ALT: 49 U/L — ABNORMAL HIGH (ref 0–44)
AST: 30 U/L (ref 15–41)
Albumin: 4.5 g/dL (ref 3.5–5.0)
Alkaline Phosphatase: 87 U/L (ref 38–126)
Anion gap: 13 (ref 5–15)
BUN: 17 mg/dL (ref 6–20)
CO2: 21 mmol/L — ABNORMAL LOW (ref 22–32)
Calcium: 9.6 mg/dL (ref 8.9–10.3)
Chloride: 105 mmol/L (ref 98–111)
Creatinine, Ser: 1.12 mg/dL (ref 0.61–1.24)
GFR, Estimated: >60 mL/min
Glucose, Bld: 158 mg/dL — ABNORMAL HIGH (ref 70–99)
Potassium: 4.3 mmol/L (ref 3.5–5.1)
Sodium: 139 mmol/L (ref 135–145)
Total Bilirubin: 0.6 mg/dL (ref 0.0–1.2)
Total Protein: 7.2 g/dL (ref 6.5–8.1)

## 2024-07-26 LAB — TROPONIN T, HIGH SENSITIVITY: Troponin T High Sensitivity: <15 ng/L (ref 0–19)

## 2024-07-26 NOTE — ED Notes (Signed)
 Patient called in lobby to update vitals, did not respond.

## 2024-07-26 NOTE — ED Provider Triage Note (Cosign Needed)
 Emergency Medicine Provider Triage Evaluation Note  Samuel Blackburn , a 58 y.o. male  was evaluated in triage.  Pt complains of chest pressure and dizziness this morning. +nausea. Dizzy and nausea better but chest pressure continues. Near sumcp[a; pm the way here.  Review of Systems  Positive:  Negative:   Physical Exam  BP (!) 138/92 (BP Location: Right Arm)   Pulse 70   Temp 98 F (36.7 C) (Oral)   Resp 16   SpO2 97%  Gen:   Awake, no distress   Resp:  Normal effort  MSK:   Moves extremities without difficulty  Other:    Medical Decision Making  Medically screening exam initiated at 9:41 AM.  Appropriate orders placed.  Samuel Blackburn was informed that the remainder of the evaluation will be completed by another provider, this initial triage assessment does not replace that evaluation, and the importance of remaining in the ED until their evaluation is complete.     [name], PA-C 07/26/24 [phone]

## 2024-07-26 NOTE — ED Triage Notes (Signed)
 Pt woke up this morning with extreme dizziness, nausea, tightness in chest and shob. Taking bp meds as prescribed. Denies numbess. Dizziness and nausea have mostly resolved.

## 2024-08-05 ENCOUNTER — Encounter: Payer: Self-pay | Admitting: Family Medicine

## 2024-08-05 ENCOUNTER — Ambulatory Visit: Admitting: Family Medicine

## 2024-08-05 VITALS — BP 124/78 | HR 76 | Temp 98.1°F | Wt 245.0 lb

## 2024-08-05 DIAGNOSIS — R7401 Elevation of levels of liver transaminase levels: Secondary | ICD-10-CM | POA: Diagnosis not present

## 2024-08-05 DIAGNOSIS — R7309 Other abnormal glucose: Secondary | ICD-10-CM | POA: Diagnosis not present

## 2024-08-05 DIAGNOSIS — R0789 Other chest pain: Secondary | ICD-10-CM | POA: Diagnosis not present

## 2024-08-05 DIAGNOSIS — Z23 Encounter for immunization: Secondary | ICD-10-CM

## 2024-08-05 LAB — COMPREHENSIVE METABOLIC PANEL WITH GFR
ALT: 50 U/L (ref 3–53)
AST: 23 U/L (ref 5–37)
Albumin: 4.9 g/dL (ref 3.5–5.2)
Alkaline Phosphatase: 71 U/L (ref 39–117)
BUN: 22 mg/dL (ref 6–23)
CO2: 31 meq/L (ref 19–32)
Calcium: 9.6 mg/dL (ref 8.4–10.5)
Chloride: 103 meq/L (ref 96–112)
Creatinine, Ser: 1.29 mg/dL (ref 0.40–1.50)
GFR: 61.39 mL/min
Glucose, Bld: 110 mg/dL — ABNORMAL HIGH (ref 70–99)
Potassium: 4.5 meq/L (ref 3.5–5.1)
Sodium: 139 meq/L (ref 135–145)
Total Bilirubin: 0.7 mg/dL (ref 0.2–1.2)
Total Protein: 7.3 g/dL (ref 6.0–8.3)

## 2024-08-05 LAB — POCT GLYCOSYLATED HEMOGLOBIN (HGB A1C)
HbA1c POC (<> result, manual entry): 6.2 %
HbA1c, POC (controlled diabetic range): 6.2 % (ref 0.0–7.0)
HbA1c, POC (prediabetic range): 6.2 % (ref 5.7–6.4)
Hemoglobin A1C: 6.2 % — AB (ref 4.0–5.6)

## 2024-08-05 MED ORDER — LISINOPRIL 5 MG PO TABS: 5.0000 mg | ORAL_TABLET | Freq: Every day | ORAL | 5 refills | Status: AC

## 2024-08-05 NOTE — Progress Notes (Signed)
 "      Samuel Blackburn , 1966-05-13, 59 y.o., male MRN: [id number] Patient Care Team    Relationship Specialty Notifications Start End  Catherine Charlies LABOR, DO PCP - General Family Medicine  10/30/20   Bernie Lamar PARAS, MD Consulting Physician Cardiology  08/24/21   Avram Lupita BRAVO, MD Consulting Physician Gastroenterology  02/20/24   Tobie Tonita POUR, DO Consulting Physician Neurology  06/01/24     Chief Complaint  Patient presents with   Dizziness    12/29; dizziness, chest pressure, shortness of breath. Pt concerned with lab results from ED.      Subjective: Samuel Blackburn is a 59 y.o. Pt presents for an OV with concerned over recent ED visit labs.  He presented to the ED with vertigo symptoms, room spinning when he woke up in the morning.  He states had an unsteady gait with the vertigo while he walked into his bathroom, he felt some chest pressure and was a little winded during this time as well.  He went to the ED for workup to make sure he was not having a heart attack.  He does have a history of hypertension, hyperlipidemia and coronary artery disease.  At his physical in July he had a mildly elevated A1c.  Patient reports he was fasting at his ED visit and his glucose was 158.  He has not been able to be as active as he normally is and this has put on some weight.  He is having orthopedic issues causing him to be more sedentary, he is having hip surgery at the end of this month and hopes to be able to get back to his normal activity level thereafter on his weight.    08/05/2024   10:11 AM 02/20/2024    1:51 PM 02/14/2023    1:20 PM 07/26/2022    1:14 PM 02/08/2022    1:10 PM  Depression screen PHQ 2/9  Decreased Interest 0 0 1 0 1  Down, Depressed, Hopeless 1 0 2 0 1  PHQ - 2 Score 1 0 3 0 2  Altered sleeping 2  2    Tired, decreased energy 2  2    Change in appetite 0  0    Feeling bad or failure about yourself  0  0    Trouble concentrating 1  2    Moving slowly or  fidgety/restless 0  0    Suicidal thoughts 0      PHQ-9 Score 6  9     Difficult doing work/chores Somewhat difficult  Somewhat difficult       Data saved with a previous flowsheet row definition    Allergies[1] Social History   Social History Narrative   Marital status/children/pets: Married   Education/employment: Oncologist, works as an manufacturing systems engineer.   Safety:      -smoke alarm in the home:Yes     - wears seatbelt: Yes      Are you right handed or left handed? Right Handed    Are you currently employed ? 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 No    Who lives with you? Randine    What type of home do you live in: 1 story or 2 story? Lives in a one story home. Stairs leading up to front entrance        Past Medical History:  Diagnosis Date   Allergies  Blood per rectum 02/14/2023   Bruit    Cervical radiculopathy    Chest pain 10/07/2012   Chicken pox    COVID-19 04/2023   Depression    Dyspnea on exertion 01/16/2021   Erectile dysfunction    Fatty pancreas    Hepatic steatosis    History of fainting spells of unknown cause    Hyperlipemia    Hypertension    IGT (impaired glucose tolerance)    LAE (left atrial enlargement) 01/2020   Markedly dilated    LVH (left ventricular hypertrophy) 02/15/2020   Mild concentric LVH   Migraines    Mitral regurgitation    Polycythemia    Had hematology work-up and condition is benign   Rectal bleeding    Splenomegaly    Mild    Stage 3 chronic kidney disease (HCC)    Vitamin D  deficiency    Past Surgical History:  Procedure Laterality Date   COLONOSCOPY  20118   WISDOM TOOTH EXTRACTION     years ago   Family History  Problem Relation Age of Onset   Stroke Mother    Early death Mother    Depression Mother    Anxiety disorder Mother    Rheumatic fever Mother    Diabetes Father    Heart disease Father    Hyperlipidemia Father     Colon cancer Father    Healthy Brother    Diabetes Paternal Aunt    Depression Maternal Grandmother    Miscarriages / Stillbirths Maternal Grandmother    Hyperlipidemia Maternal Grandfather    Hypertension Maternal Grandfather    Diabetes Paternal Grandmother    COPD Paternal Grandfather    Hearing loss Paternal Grandfather    Heart disease Paternal Grandfather    Iron deficiency Paternal Grandfather    Healthy Daughter    Healthy Son    Rectal cancer Neg Hx    Stomach cancer Neg Hx    Esophageal cancer Neg Hx    Allergies as of 08/05/2024       Reactions   Dust Mite Extract Other (See Comments)   Pollen, dust, and wire- EYES BURN & tired per pt        Medication List        Accurate as of August 05, 2024 11:17 AM. If you have any questions, ask your nurse or doctor.          aspirin EC 81 MG tablet Take 162 mg by mouth daily. Swallow whole.   atorvastatin  80 MG tablet Commonly known as: LIPITOR Take 1 tablet (80 mg total) by mouth at bedtime. What changed: how much to take   Cholecalciferol 25 MCG (1000 UT) capsule Take 1,000 Units by mouth daily.   Co Q-10 100 MG Caps Take 1 capsule by mouth daily.   Fish Oil 645 MG Caps Take 1 tablet by mouth daily.   lisinopril  5 MG tablet Commonly known as: Zestril  Take 1 tablet (5 mg total) by mouth daily.   Magnesium 100 MG Tabs Take 1 tablet by mouth daily.   multivitamin tablet Take 1 tablet by mouth daily. Unknown strenght What changed: additional instructions        All past medical history, surgical history, allergies, family history, immunizations andmedications were updated in the EMR today and reviewed under the history and medication portions of their EMR.     Review of Systems  Constitutional:  Negative for chills, fever and malaise/fatigue.  HENT: Negative.    Eyes: Negative.  Respiratory:  Negative for cough.   Cardiovascular: Negative.   Gastrointestinal: Negative.   Genitourinary:  Negative.   Musculoskeletal: Negative.   Skin:  Negative for rash.  Neurological: Negative.   Endo/Heme/Allergies: Negative.   Psychiatric/Behavioral: Negative.     Negative, with the exception of above mentioned in HPI   Objective:  BP 124/78   Pulse 76   Temp 98.1 F (36.7 C)   Wt 245 lb (111.1 kg)   SpO2 97%   BMI 34.17 kg/m  Body mass index is 34.17 kg/m. Physical Exam Vitals and nursing note reviewed. Exam conducted with a chaperone present.  Constitutional:      General: He is not in acute distress.    Appearance: Normal appearance. He is not ill-appearing, toxic-appearing or diaphoretic.  HENT:     Head: Normocephalic and atraumatic.  Eyes:     General: No scleral icterus.       Right eye: No discharge.        Left eye: No discharge.     Extraocular Movements: Extraocular movements intact.     Pupils: Pupils are equal, round, and reactive to light.  Cardiovascular:     Rate and Rhythm: Normal rate and regular rhythm.     Heart sounds: No murmur heard. Pulmonary:     Effort: Pulmonary effort is normal. No respiratory distress.     Breath sounds: Normal breath sounds. No wheezing, rhonchi or rales.  Musculoskeletal:     Right lower leg: No edema.     Left lower leg: No edema.  Skin:    General: Skin is warm.     Findings: No rash.  Neurological:     Mental Status: He is alert and oriented to person, place, and time. Mental status is at baseline.  Psychiatric:        Mood and Affect: Mood normal.        Behavior: Behavior normal.        Thought Content: Thought content normal.        Judgment: Judgment normal.     No results found. No results found. Results for orders placed or performed in visit on 08/05/24 (from the past 24 hours)  POCT HgB A1C     Status: Abnormal   Collection Time: 08/05/24 10:50 AM  Result Value Ref Range   Hemoglobin A1C 6.2 (A) 4.0 - 5.6 %   HbA1c POC (<> result, manual entry) 6.2 4.0 - 5.6 %   HbA1c, POC (prediabetic range)  6.2 5.7 - 6.4 %   HbA1c, POC (controlled diabetic range) 6.2 0.0 - 7.0 %    Assessment/Plan: Samuel Blackburn is a 59 y.o. male present for OV for  Influenza vaccine needed - Flu vaccine trivalent PF, 6mos and older(Flulaval,Afluria,Fluarix,Fluzone)  Elevated hemoglobin A1c/prediabetes He had an elevated sugar in the ED, he says that he was fasting at that time. - POCT HgB A1C today 6.2. We will move forward with his total hip replacement at the end of this month.  Hopefully after recovery he will be able to get back to his normal activity level, and lose some of the weight he has put on which will have a positive effect on his prediabetes condition.  Elevated ALT measurement/chest pressure We discussed his vertigo symptoms and mild elevation in liver enzymes, may have been an acute illness.  He did not feel well rested that day, but felt better the next day.  Will repeat liver enzymes today be complete - Comp Met (CMET) -  Cardiac workup in ED was reassuring   Reviewed expectations re: course of current medical issues. Discussed self-management of symptoms. Outlined signs and symptoms indicating need for more acute intervention. Patient verbalized understanding and all questions were answered. Patient received an After-Visit Summary.    Orders Placed This Encounter  Procedures   Flu vaccine trivalent PF, 6mos and older(Flulaval,Afluria,Fluarix,Fluzone)   Comp Met (CMET)   POCT HgB A1C   Meds ordered this encounter  Medications   lisinopril  (ZESTRIL ) 5 MG tablet    Sig: Take 1 tablet (5 mg total) by mouth daily.    Dispense:  30 tablet    Refill:  5   Referral Orders  No referral(s) requested today     Note is dictated utilizing voice recognition software. Although note has been proof read prior to signing, occasional typographical errors still can be missed. If any questions arise, please do not hesitate to call for verification.   electronically signed by:  Charlies Bellini, DO  [facility] [place]       [1]  Allergies Allergen Reactions   Dust Mite Extract Other (See Comments)    Pollen, dust, and wire- EYES BURN & tired per pt   "

## 2024-08-09 ENCOUNTER — Ambulatory Visit: Payer: Self-pay | Admitting: Family Medicine

## 2024-08-12 ENCOUNTER — Other Ambulatory Visit: Payer: Self-pay | Admitting: Physician Assistant

## 2024-08-12 DIAGNOSIS — Z01818 Encounter for other preprocedural examination: Secondary | ICD-10-CM

## 2024-08-19 NOTE — Patient Instructions (Signed)
 SURGICAL WAITING ROOM VISITATION Patients having surgery or a procedure may have no more than 2 support people in the waiting area - these visitors may rotate in the visitor waiting room.   Due to an increase in RSV and influenza rates and associated hospitalizations, children ages 67 and under may not visit patients in [facility] hospitals. If the patient needs to stay at the hospital during part of their recovery, the visitor guidelines for inpatient rooms apply.  PRE-OP VISITATION  Pre-op nurse will coordinate an appropriate time for 1 support person to accompany the patient in pre-op.  This support person may not rotate.  This visitor will be contacted when the time is appropriate for the visitor to come back in the pre-op area.  Please refer to the [facility] website for the visitor guidelines for Inpatients (after your surgery is over and you are in a regular room).  You are not required to quarantine at this time prior to your surgery. However, you must do this: Hand Hygiene often Do NOT share personal items Notify your provider if you are in close contact with someone who has COVID or you develop fever 100.4 or greater, new onset of sneezing, cough, sore throat, shortness of breath or body aches.  If you test positive for Covid or have been in contact with anyone that has tested positive in the last 10 days please notify you surgeon.    Your procedure is scheduled on:  08/27/24  Report to [facility] Main Entrance: [place] entrance where the [employer] is available.   Report to admitting at: 5:15 AM  Call this number if you have any questions or problems the morning of surgery [phone]  FOLLOW ANY ADDITIONAL PRE OP INSTRUCTIONS YOU RECEIVED FROM YOUR SURGEON'S OFFICE!!!  Do not eat food after Midnight the night prior to your surgery/procedure.  After Midnight you may have the following liquids until : 4:15 AM DAY OF SURGERY  Clear Liquid Diet Water Black  Coffee (sugar ok, NO MILK/CREAM OR CREAMERS)  Tea (sugar ok, NO MILK/CREAM OR CREAMERS) regular and decaf                             Plain Jell-O  with no fruit (NO RED)                                           Fruit ices (not with fruit pulp, NO RED)                                     Popsicles (NO RED)                                                                  Juice: NO CITRUS JUICES: only apple, WHITE grape, WHITE cranberry Sports drinks like Gatorade or Powerade (NO RED)   The day of surgery:  Drink ONE (1) Pre-Surgery Clear G2 at : 4:15 AM the morning of surgery. Drink in one sitting. Do not sip.  This drink was  given to you during your hospital pre-op appointment visit. Nothing else to drink after completing the Pre-Surgery Clear Ensure or G2 : No candy, chewing gum or throat lozenges.    Oral Hygiene is also important to reduce your risk of infection.        Remember - BRUSH YOUR TEETH THE MORNING OF SURGERY WITH YOUR REGULAR TOOTHPASTE  Do NOT smoke after Midnight the night before surgery.  STOP TAKING all Vitamins, Herbs and supplements 1 week before your surgery.   Take ONLY these medicines the morning of surgery with A SIP OF WATER: NONE.   If You have been diagnosed with Sleep Apnea - Bring CPAP mask and tubing day of surgery. We will provide you with a CPAP machine on the day of your surgery.                   You may not have any metal on your body including hair pins, jewelry, and body piercing  Do not wear lotions, powders, perfumes / cologne, or deodorant   Men may shave face and neck.  Contacts, Hearing Aids, dentures or bridgework may not be worn into surgery. DENTURES WILL BE REMOVED PRIOR TO SURGERY PLEASE DO NOT APPLY Poly grip OR ADHESIVES!!!  You may bring a small overnight bag with you on the day of surgery, only pack items that are not valuable. [facility] IS NOT RESPONSIBLE   FOR VALUABLES THAT ARE LOST OR STOLEN.   Patients discharged on the  day of surgery will not be allowed to drive home.  Someone NEEDS to stay with you for the first 24 hours after anesthesia.  Do not bring your home medications to the hospital. The Pharmacy will dispense medications listed on your medication list to you during your admission in the Hospital.  Special Instructions: Bring a copy of your healthcare power of attorney and living will documents the day of surgery, if you wish to have them scanned into your [facility] Records- EPIC  Please read over the following fact sheets you were given: IF YOU HAVE QUESTIONS ABOUT YOUR PRE-OP INSTRUCTIONS, PLEASE CALL [phone]  PATIENT SIGNATURE_________________________________  NURSE SIGNATURE__________________________________  ________________________________________________________________________  Pre-operative 4 CHG Bath Instructions  DYNA-Hex 4 Chlorhexidine Gluconate 4% Solution Antiseptic 4 fl. oz   You can play a key role in reducing the risk of infection after surgery. Your skin needs to be as free of germs as possible. You can reduce the number of germs on your skin by washing with CHG (chlorhexidine gluconate) soap before surgery. CHG is an antiseptic soap that kills germs and continues to kill germs even after washing.   DO NOT use if you have an allergy to chlorhexidine/CHG or antibacterial soaps. If your skin becomes reddened or irritated, stop using the CHG and notify one of our RNs at   Please shower with the CHG soap starting 4 days before surgery using the following schedule:     Please keep in mind the following:  DO NOT shave, including legs and underarms, starting the day of your first shower.   You may shave your face at any point before/day of surgery.  Place clean sheets on your bed the day you start using CHG soap. Use a clean washcloth (not used since being washed) for each shower. DO NOT sleep with pets once you start using the CHG.  CHG Shower Instructions:  If  you choose to wash your hair and private area, wash first with your normal shampoo/soap.  After you use shampoo/soap, rinse your hair and body thoroughly to remove shampoo/soap residue.  Turn the water OFF and apply about 3 tablespoons (45 ml) of CHG soap to a CLEAN washcloth.  Apply CHG soap ONLY FROM YOUR NECK DOWN TO YOUR TOES (washing for 3-5 minutes)  DO NOT use CHG soap on face, private areas, open wounds, or sores.  Pay special attention to the area where your surgery is being performed.  If you are having back surgery, having someone wash your back for you may be helpful. Wait 2 minutes after CHG soap is applied, then you may rinse off the CHG soap.  Pat dry with a clean towel  Put on clean clothes/pajamas   If you choose to wear lotion, please use ONLY the CHG-compatible lotions on the back of this paper.     Additional instructions for the day of surgery: DO NOT APPLY any lotions, deodorants, cologne, or perfumes.   Put on clean/comfortable clothes.  Brush your teeth.  Ask your nurse before applying any prescription medications to the skin.   CHG Compatible Lotions   Aveeno Moisturizing lotion  Cetaphil Moisturizing Cream  Cetaphil Moisturizing Lotion  Clairol Herbal Essence Moisturizing Lotion, Dry Skin  Clairol Herbal Essence Moisturizing Lotion, Extra Dry Skin  Clairol Herbal Essence Moisturizing Lotion, Normal Skin  Curel Age Defying Therapeutic Moisturizing Lotion with Alpha Hydroxy  Curel Extreme Care Body Lotion  Curel Soothing Hands Moisturizing Hand Lotion  Curel Therapeutic Moisturizing Cream, Fragrance-Free  Curel Therapeutic Moisturizing Lotion, Fragrance-Free  Curel Therapeutic Moisturizing Lotion, Original Formula  Eucerin Daily Replenishing Lotion  Eucerin Dry Skin Therapy Plus Alpha Hydroxy Crme  Eucerin Dry Skin Therapy Plus Alpha Hydroxy Lotion  Eucerin Original Crme  Eucerin Original Lotion  Eucerin Plus Crme Eucerin Plus Lotion  Eucerin TriLipid  Replenishing Lotion  Keri Anti-Bacterial Hand Lotion  Keri Deep Conditioning Original Lotion Dry Skin Formula Softly Scented  Keri Deep Conditioning Original Lotion, Fragrance Free Sensitive Skin Formula  Keri Lotion Fast Absorbing Fragrance Free Sensitive Skin Formula  Keri Lotion Fast Absorbing Softly Scented Dry Skin Formula  Keri Original Lotion  Keri Skin Renewal Lotion Keri Silky Smooth Lotion  Keri Silky Smooth Sensitive Skin Lotion  Nivea Body Creamy Conditioning Oil  Nivea Body Extra Enriched Lotion  Nivea Body Original Lotion  Nivea Body Sheer Moisturizing Lotion Nivea Crme  Nivea Skin Firming Lotion  NutraDerm 30 Skin Lotion  NutraDerm Skin Lotion  NutraDerm Therapeutic Skin Cream  NutraDerm Therapeutic Skin Lotion  ProShield Protective Hand Cream  Provon moisturizing lotion  Incentive Spirometer  An incentive spirometer is a tool that can help keep your lungs clear and active. This tool measures how well you are filling your lungs with each breath. Taking long deep breaths may help reverse or decrease the chance of developing breathing (pulmonary) problems (especially infection) following: A long period of time when you are unable to move or be active. BEFORE THE PROCEDURE  If the spirometer includes an indicator to show your best effort, your nurse or respiratory therapist will set it to a desired goal. If possible, sit up straight or lean slightly forward. Try not to slouch. Hold the incentive spirometer in an upright position. INSTRUCTIONS FOR USE  Sit on the edge of your bed if possible, or sit up as far as you can in bed or on a chair. Hold the incentive spirometer in an upright position. Breathe out normally. Place the mouthpiece in your mouth and seal your lips tightly around  it. Breathe in slowly and as deeply as possible, raising the piston or the ball toward the top of the column. Hold your breath for 3-5 seconds or for as long as possible. Allow the piston  or ball to fall to the bottom of the column. Remove the mouthpiece from your mouth and breathe out normally. Rest for a few seconds and repeat Steps 1 through 7 at least 10 times every 1-2 hours when you are awake. Take your time and take a few normal breaths between deep breaths. The spirometer may include an indicator to show your best effort. Use the indicator as a goal to work toward during each repetition. After each set of 10 deep breaths, practice coughing to be sure your lungs are clear. If you have an incision (the cut made at the time of surgery), support your incision when coughing by placing a pillow or rolled up towels firmly against it. Once you are able to get out of bed, walk around indoors and cough well. You may stop using the incentive spirometer when instructed by your caregiver.  RISKS AND COMPLICATIONS Take your time so you do not get dizzy or light-headed. If you are in pain, you may need to take or ask for pain medication before doing incentive spirometry. It is harder to take a deep breath if you are having pain. AFTER USE Rest and breathe slowly and easily. It can be helpful to keep track of a log of your progress. Your caregiver can provide you with a simple table to help with this. If you are using the spirometer at home, follow these instructions: SEEK MEDICAL CARE IF:  You are having difficultly using the spirometer. You have trouble using the spirometer as often as instructed. Your pain medication is not giving enough relief while using the spirometer. You develop fever of 100.5 F (38.1 C) or higher. SEEK IMMEDIATE MEDICAL CARE IF:  You cough up bloody sputum that had not been present before. You develop fever of 102 F (38.9 C) or greater. You develop worsening pain at or near the incision site. MAKE SURE YOU:  Understand these instructions. Will watch your condition. Will get help right away if you are not doing well or get worse. Document Released:  11/25/2006 Document Revised: 10/07/2011 Document Reviewed: 01/26/2007 [facility] Patient Information 2014 [place], [place].   ________________________________________________________________________

## 2024-08-20 ENCOUNTER — Encounter (HOSPITAL_COMMUNITY): Payer: Self-pay

## 2024-08-20 ENCOUNTER — Encounter (HOSPITAL_COMMUNITY)
Admission: RE | Admit: 2024-08-20 | Discharge: 2024-08-20 | Disposition: A | Source: Ambulatory Visit | Attending: Orthopaedic Surgery

## 2024-08-20 ENCOUNTER — Other Ambulatory Visit: Payer: Self-pay

## 2024-08-20 VITALS — BP 138/92 | HR 76 | Temp 97.9°F | Ht 70.0 in | Wt 243.0 lb

## 2024-08-20 DIAGNOSIS — Z01812 Encounter for preprocedural laboratory examination: Secondary | ICD-10-CM | POA: Insufficient documentation

## 2024-08-20 DIAGNOSIS — Z01818 Encounter for other preprocedural examination: Secondary | ICD-10-CM

## 2024-08-20 HISTORY — DX: Prediabetes: R73.03

## 2024-08-20 HISTORY — DX: Anxiety disorder, unspecified: F41.9

## 2024-08-20 HISTORY — DX: Unspecified osteoarthritis, unspecified site: M19.90

## 2024-08-20 LAB — SURGICAL PCR SCREEN
MRSA, PCR: NEGATIVE
Staphylococcus aureus: NEGATIVE

## 2024-08-20 LAB — CBC
HCT: 49.7 % (ref 39.0–52.0)
Hemoglobin: 16.4 g/dL (ref 13.0–17.0)
MCH: 30.9 pg (ref 26.0–34.0)
MCHC: 33 g/dL (ref 30.0–36.0)
MCV: 93.6 fL (ref 80.0–100.0)
Platelets: 278 K/uL (ref 150–400)
RBC: 5.31 MIL/uL (ref 4.22–5.81)
RDW: 12.7 % (ref 11.5–15.5)
WBC: 6.6 K/uL (ref 4.0–10.5)
nRBC: 0 % (ref 0.0–0.2)

## 2024-08-20 LAB — BASIC METABOLIC PANEL WITH GFR
Anion gap: 9 (ref 5–15)
BUN: 23 mg/dL — ABNORMAL HIGH (ref 6–20)
CO2: 28 mmol/L (ref 22–32)
Calcium: 9.5 mg/dL (ref 8.9–10.3)
Chloride: 103 mmol/L (ref 98–111)
Creatinine, Ser: 1.19 mg/dL (ref 0.61–1.24)
GFR, Estimated: >60 mL/min
Glucose, Bld: 142 mg/dL — ABNORMAL HIGH (ref 70–99)
Potassium: 4.3 mmol/L (ref 3.5–5.1)
Sodium: 139 mmol/L (ref 135–145)

## 2024-08-20 NOTE — Progress Notes (Signed)
 For Anesthesia: PCP - Catherine, Renee A, DO LOV: 08/05/24 Cardiologist - Krasowski, Robert J, MD LOV: 01/16/21  Bowel Prep reminder:  Chest x-ray - 07/26/24. CT cardiac: 12/15/20 EKG - 07/26/24 Stress Test -  ECHO - 01/23/21 Cardiac Cath -  Pacemaker/ICD device last checked: Pacemaker orders received: Device Rep notified:  Spinal Cord Stimulator:N/A  Sleep Study - Yes CPAP - NO  Fasting Blood Sugar - N/A Checks Blood Sugar _____ times a day Date and result of last Hgb A1c-  Last dose of GLP1 agonist- N/A GLP1 instructions: Hold 7 days prior to schedule (Hold 24 hours-daily)   Last dose of SGLT-2 inhibitors- N/A SGLT-2 instructions: Hold 72 hours prior to surgery  Blood Thinner Instructions:N/A Last Dose: Time last taken:  Aspirin Instructions: Last Dose: Time last taken:  Activity level: Can go up a flight of stairs and activities of daily living without stopping and without chest pain and/or shortness of breath   Able to exercise without chest pain and/or shortness of breath    Anesthesia review: Hx: HTN,Mitral valve regurgitation,chest pain,CKD 3,OSSA(NO CPAP).  Patient denies shortness of breath, fever, cough and chest pain at PAT appointment   Patient verbalized understanding of instructions that were reviewed over the telephone.

## 2024-08-23 ENCOUNTER — Encounter (HOSPITAL_COMMUNITY): Payer: Self-pay

## 2024-08-23 NOTE — Progress Notes (Signed)
 " Case: [id number] Date/Time: 08/27/24 0700   Procedure: ARTHROPLASTY, HIP, TOTAL, ANTERIOR APPROACH (Left: Hip)   Anesthesia type: Spinal   Diagnosis: Primary osteoarthritis of left hip [M16.12]   Pre-op diagnosis: osteoarthritis left hip   Location: [facility] ROOM 09 / [facility]   Surgeons: Vernetta Lonni GRADE, MD       DISCUSSION: Samuel Blackburn is a 59 yo male with PMH of HTN, mitral regurgitation, DOE, migraines, CKD3, anxiety, depression, arthritis.  Evaluated by Cardiology in the past for atypical chest pain with DOE and fatigue. Had extensive w/u with CT calcium  score in 2022 which showed minimal coronary calcifications and Echo which showed normal LVEF 55-60%, mild MR. Patient was on Lisinopril  and noted that when he stopped this med symptoms improved.   Seen in the ED for chest pain in 10/2023. W/u unremarkable. Seen in the ED again on 02/08/24 for near syncope and right arm numbness. Diagnosed with possible TIA. Advised start Plavix  and f/u with Neurology and Cardiology.   Seen by Neurology on 06/01/24 for right arm numbness. Felt not to be TIA and more likely nerve entrapment. He was advised f/u in Cardiology for near syncope.  Another ED visit on 07/26/24 for extreme dizziness, nausea, chest pain and SOB. Patient left without being seen.   Seen by PCP on 08/05/24.   Cardiac clearance ***    VS: BP (!) 138/92   Pulse 76   Temp 36.6 C (Oral)   Ht 5' 10 (1.778 m)   Wt 110.2 kg   SpO2 98%   BMI 34.87 kg/m   PROVIDERS: Kuneff, Renee A, DO   LABS: Labs reviewed: Acceptable for surgery. (all labs ordered are listed, but only abnormal results are displayed)  Labs Reviewed  BASIC METABOLIC PANEL WITH GFR - Abnormal; Notable for the following components:      Result Value   Glucose, Bld 142 (*)    BUN 23 (*)    All other components within normal limits  SURGICAL PCR SCREEN  CBC  TYPE AND SCREEN     EKG 07/26/24:  SR Probable LAE Low voltage, precordial  leads Anteroseptal infarct, old  Echo 01/23/2021:  IMPRESSIONS    1. Left ventricular ejection fraction, by estimation, is 55 to 60%. The left ventricle has normal function. The left ventricle has no regional wall motion abnormalities. The global longitudinal strain is low normal.The average left ventricular global longitudinal strain is -17.6 %. Regional strain abnormalities in the basal inferosepal may related to oversampling of the mitral valve plane.  2. Right ventricular systolic function is normal. The right ventricular size is mildly enlarged. There is normal pulmonary artery systolic pressure. The estimated right ventricular systolic pressure is 24.8 mmHg.  3. The aortic valve is tricuspid. Aortic valve regurgitation is not visualized. No aortic stenosis is present.  4. The mitral valve is normal in structure. Mild mitral valve regurgitation. No evidence of mitral stenosis.  5. The inferior vena cava is normal in size with <50% respiratory variability, suggesting right atrial pressure of 8 mmHg.  Comparison(s): No prior Echocardiogram. Unable to access prior echocardiograms or reports within system (syngo or LTS).  CT calcium  score 12/15/2020:  IMPRESSION: Coronary calcium  score of 22.3 isolated to circumflex . This was 63 th percentile for age-, race-, and sex-matched controls.  Past Medical History:  Diagnosis Date   Allergies    Anxiety    Arthritis    Blood per rectum 02/14/2023   Bruit    Cervical radiculopathy  Chest pain 10/07/2012   Chicken pox    COVID-19 04/2023   Depression    Dyspnea on exertion 01/16/2021   Erectile dysfunction    Fatty pancreas    Hepatic steatosis    History of fainting spells of unknown cause    Hyperlipemia    Hypertension    IGT (impaired glucose tolerance)    LAE (left atrial enlargement) 01/2020   Markedly dilated    LVH (left ventricular hypertrophy) 02/15/2020   Mild concentric LVH   Migraines    Mitral  regurgitation    Polycythemia    Had hematology work-up and condition is benign   Pre-diabetes    Rectal bleeding    Splenomegaly    Mild    Stage 3 chronic kidney disease (HCC)    Vitamin D  deficiency     Past Surgical History:  Procedure Laterality Date   CARDIAC CATHETERIZATION     COLONOSCOPY  20118   WISDOM TOOTH EXTRACTION     years ago    MEDICATIONS:  ASHWAGANDHA PO   aspirin EC 81 MG tablet   atorvastatin  (LIPITOR) 80 MG tablet   Berberine Chloride (BERBERINE HCI PO)   Cholecalciferol 25 MCG (1000 UT) capsule   Coenzyme Q10 (CO Q-10) 100 MG CAPS   lisinopril  (ZESTRIL ) 5 MG tablet   MAGNESIUM PO   MELATONIN PO   Multiple Vitamin (MULTIVITAMIN) tablet   Omega-3 Fatty Acids (FISH OIL) 645 MG CAPS   Probiotic Product (PROBIOTIC PO)   TURMERIC PO   No current facility-administered medications for this encounter.          "

## 2024-08-24 ENCOUNTER — Telehealth: Payer: Self-pay

## 2024-08-24 NOTE — Telephone Encounter (Signed)
"  ° °  Pre-operative Risk Assessment    Patient Name: Samuel Blackburn  DOB: [birth year] MRN: [id number]   Date of last office visit: 01/16/21 Date of next office visit: 08/25/24   Request for Surgical Clearance    Procedure:  Left total hip arthroplasty  Date of Surgery:  Clearance TBD                                Surgeon:  Not provided Surgeon's Group or Practice Name:  [facility] Phone number:  [phone] Fax number:  [phone]   Type of Clearance Requested:   - Medical    Type of Anesthesia:  Spinal   Additional requests/questions:  Urgent Request  SignedIval LOISE Collet   08/24/2024, 4:22 PM   "

## 2024-08-24 NOTE — Progress Notes (Unsigned)
" °  Cardiology Office Note   Date:  08/24/2024  ID:  Samuel Blackburn, DOB 1965/10/11, MRN [id number] PCP: Catherine Charlies LABOR, DO  [facility] Providers Cardiologist:  None { Click to update primary MD,subspecialty MD or APP then REFRESH:1}    History of Present Illness Samuel Blackburn is a 59 y.o. male ***  ROS: ***  Studies Reviewed      *** Risk Assessment/Calculations {Does this patient have ATRIAL FIBRILLATION?:[phone]} No BP recorded.  {Refresh Note OR Click here to enter BP  :1}***       Physical Exam VS:  There were no vitals taken for this visit.       Wt Readings from Last 3 Encounters:  08/20/24 243 lb (110.2 kg)  08/05/24 245 lb (111.1 kg)  07/07/24 252 lb (114.3 kg)    GEN: Well nourished, well developed in no acute distress NECK: No JVD; No carotid bruits CARDIAC: ***RRR, no murmurs, rubs, gallops RESPIRATORY:  Clear to auscultation without rales, wheezing or rhonchi  ABDOMEN: Soft, non-tender, non-distended EXTREMITIES:  No edema; No deformity   ASSESSMENT AND PLAN ***    {Are you ordering a CV Procedure (e.g. stress test, cath, DCCV, TEE, etc)?   Press F2        :[id number]}  Dispo: ***  Signed, Orren LOISE Fabry, PA-C   "

## 2024-08-25 ENCOUNTER — Ambulatory Visit: Admitting: Physician Assistant

## 2024-08-25 ENCOUNTER — Ambulatory Visit: Admitting: Cardiovascular Disease

## 2024-08-25 ENCOUNTER — Encounter: Payer: Self-pay | Admitting: Cardiovascular Disease

## 2024-08-25 VITALS — BP 144/94 | HR 106 | Ht 70.5 in | Wt 247.2 lb

## 2024-08-25 DIAGNOSIS — I440 Atrioventricular block, first degree: Secondary | ICD-10-CM

## 2024-08-25 DIAGNOSIS — I10 Essential (primary) hypertension: Secondary | ICD-10-CM

## 2024-08-25 DIAGNOSIS — I517 Cardiomegaly: Secondary | ICD-10-CM | POA: Diagnosis not present

## 2024-08-25 DIAGNOSIS — Z01818 Encounter for other preprocedural examination: Secondary | ICD-10-CM | POA: Insufficient documentation

## 2024-08-25 DIAGNOSIS — E782 Mixed hyperlipidemia: Secondary | ICD-10-CM | POA: Diagnosis not present

## 2024-08-25 DIAGNOSIS — I2510 Atherosclerotic heart disease of native coronary artery without angina pectoris: Secondary | ICD-10-CM

## 2024-08-25 NOTE — Telephone Encounter (Signed)
 Patient is scheduled to see Orren Fabry, PA-C, today for pre-op evaluation. Therefore, will route clearance form to her so she is aware. Will remove from pre-op pool.  Rondalyn Belford E Jasminne Mealy, PA-C 08/25/2024 8:49 AM

## 2024-08-25 NOTE — Patient Instructions (Signed)
 Medication Instructions:  Your physician recommends that you continue on your current medications as directed. Please refer to the Current Medication list given to you today.  *If you need a refill on your cardiac medications before your next appointment, please call your pharmacy*  Follow-Up: At [facility], you and your health needs are our priority.  As part of our continuing mission to provide you with exceptional heart care, our providers are all part of one team.  This team includes your primary Cardiologist (physician) and Advanced Practice Providers or APPs (Physician Assistants and Nurse Practitioners) who all work together to provide you with the care you need, when you need it.  Your next appointment:   6 month(s)  Provider:   Lamar Fitch, MD    We recommend signing up for the patient portal called MyChart.  Sign up information is provided on this After Visit Summary.  MyChart is used to connect with patients for Virtual Visits (Telemedicine).  Patients are able to view lab/test results, encounter notes, upcoming appointments, etc.  Non-urgent messages can be sent to your provider as well.   To learn more about what you can do with MyChart, go to [email or web address].   Other Instructions

## 2024-08-25 NOTE — Assessment & Plan Note (Signed)
 History of essential hypertension on low-dose lisinopril  with blood pressure measured today at 144/94.  Usually his blood pressure is lower than this.  I suspect this is related to anxiety about his upcoming hip replacement.  He also did not take his lisinopril  this morning.

## 2024-08-25 NOTE — Assessment & Plan Note (Signed)
 Patient scheduled for total hip replacement by Dr. Medford Poli later this week.  He has history of normal 2D echo and a fairly low coronary calcium  score and is asymptomatic.  He is cleared from a cardiac cardiovascular standpoint at low risk.

## 2024-08-25 NOTE — Assessment & Plan Note (Signed)
 Coronary calcium  score performed 12/15/2020 was 22.3 isolated to the circumflex.  He is asymptomatic.

## 2024-08-25 NOTE — Telephone Encounter (Signed)
 This patient has not been seen by our practice since 2022. Will try and arrange MD to see to re-establish care.  Orren LOISE Fabry, PA-C

## 2024-08-25 NOTE — Progress Notes (Signed)
 "     08/25/2024 SHAAN Blackburn   1965-11-18  [id number]  Primary Physician Catherine Charlies LABOR, DO Primary Cardiologist: Dorn JINNY Lesches MD [place] [place], [place]  HPI:  Samuel Blackburn is a 59 y.o. moderately overweight married Caucasian male father of 2 children is accompanied by his wife Samuel Blackburn and son Samuel Blackburn today.  I am seeing him for preoperative clearance before total hip replacement later this week by Dr. Medford Poli.  He is a cardiology patient of Dr. Bernie.  He is a systems analyst at [employer].  His risk factors include treated hypertension and hyperlipidemia.  There is no family history of heart disease.  He has never had a heart attack or stroke.  Does have a history of vasovagal syncope syncope.  He had a coronary calcium  score performed 12/15/2020 which was 22 all in the circumflex coronary artery and a 2D echo performed 01/15/2021 which was essentially normal.  He denies chest pain or shortness of breath.   Active Medications[1]   Allergies[2]  Social History   Socioeconomic History   Marital status: Married    Spouse name: Not on file   Number of children: Not on file   Years of education: Not on file   Highest education level: Not on file  Occupational History   Not on file  Tobacco Use   Smoking status: Never   Smokeless tobacco: Never  Vaping Use   Vaping status: Never Used  Substance and Sexual Activity   Alcohol use: Not Currently   Drug use: Not Currently   Sexual activity: Not Currently  Other Topics Concern   Not on file  Social History Narrative   Marital status/children/pets: Married   Education/employment: Oncologist, works as an manufacturing systems engineer.   Safety:      -smoke alarm in the home:Yes     - wears seatbelt: Yes      Are you right handed or left handed? Right Handed    Are you currently employed ? 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 No    Who  lives with you? Samuel Blackburn    What type of home do you live in: 1 story or 2 story? Lives in a one story home. Stairs leading up to front entrance        Social Drivers of Health   Tobacco Use: Low Risk (08/25/2024)   Patient History    Smoking Tobacco Use: Never    Smokeless Tobacco Use: Never    Passive Exposure: Not on file  Financial Resource Strain: Not on file  Food Insecurity: Not on file  Transportation Needs: Not on file  Physical Activity: Not on file  Stress: Not on file  Social Connections: Not on file  Intimate Partner Violence: Not on file  Depression (EYV7-0): Medium Risk (08/05/2024)   Depression (PHQ2-9)    PHQ-2 Score: 6  Alcohol Screen: Not on file  Housing: Not on file  Utilities: Not on file  Health Literacy: Not on file     Review of Systems: General: negative for chills, fever, night sweats or weight changes.  Cardiovascular: negative for chest pain, dyspnea on exertion, edema, orthopnea, palpitations, paroxysmal nocturnal dyspnea or shortness of breath Dermatological: negative for rash Respiratory: negative for cough or wheezing Urologic: negative for hematuria Abdominal: negative for nausea, vomiting, diarrhea, bright red blood per rectum, melena, or hematemesis Neurologic: negative for visual changes, syncope, or  dizziness All other systems reviewed and are otherwise negative except as noted above.    Blood pressure (!) 144/94, pulse (!) 106, height 5' 10.5 (1.791 m), weight 247 lb 3.2 oz (112.1 kg), SpO2 96%.  General appearance: alert and no distress Neck: no adenopathy, no carotid bruit, no JVD, supple, symmetrical, trachea midline, and thyroid  not enlarged, symmetric, no tenderness/mass/nodules Lungs: clear to auscultation bilaterally Heart: regular rate and rhythm, S1, S2 normal, no murmur, click, rub or gallop Extremities: extremities normal, atraumatic, no cyanosis or edema Pulses: 2+ and symmetric Skin: Skin color, texture, turgor normal. No  rashes or lesions Neurologic: Grossly normal  EKG EKG Interpretation Date/Time:  Wednesday August 25 2024 14:12:25 EST Ventricular Rate:  106 PR Interval:  164 QRS Duration:  90 QT Interval:  320 QTC Calculation: 425 R Axis:   32  Text Interpretation: Sinus tachycardia with occasional Premature ventricular complexes T wave abnormality, consider inferior ischemia When compared with ECG of 26-Jul-2024 09:34, PREVIOUS ECG IS PRESENT Confirmed by Court Carrier [phone]) on 08/25/2024 2:20:31 PM    ASSESSMENT AND PLAN:   Hyperlipidemia History of hyperlipidemia on high-dose atorvastatin  with lipid profile performed 02/20/2024 revealing total cholesterol 162, LDL of 103 and HDL of 34, not at goal for secondary prevention given his mildly elevated coronary calcium  score.  I suspect he will ultimately need to be on Zetia  and/or PCSK9.  I will leave this to the patient and Dr. Bernie, his primary cardiologist, to decide.  Essential hypertension History of essential hypertension on low-dose lisinopril  with blood pressure measured today at 144/94.  Usually his blood pressure is lower than this.  I suspect this is related to anxiety about his upcoming hip replacement.  He also did not take his lisinopril  this morning.  Calcification of coronary artery Coronary calcium  score performed 12/15/2020 was 22.3 isolated to the circumflex.  He is asymptomatic.  Preoperative clearance Patient scheduled for total hip replacement by Dr. Medford Poli later this week.  He has history of normal 2D echo and a fairly low coronary calcium  score and is asymptomatic.  He is cleared from a cardiac cardiovascular standpoint at low risk.     Carrier DOROTHA Court MD FACP,FACC,FAHA, [facility] 08/25/2024 2:34 PM    [1]  Current Meds  Medication Sig   ASHWAGANDHA PO Take 1 tablet by mouth daily.   aspirin EC 81 MG tablet Take 162 mg by mouth at bedtime. Swallow whole.   atorvastatin  (LIPITOR) 80 MG tablet Take 1  tablet (80 mg total) by mouth at bedtime.   Berberine Chloride (BERBERINE HCI PO) Take 1 tablet by mouth daily.   Cholecalciferol 25 MCG (1000 UT) capsule Take 1,000 Units by mouth daily.   Coenzyme Q10 (CO Q-10) 100 MG CAPS Take 1 capsule by mouth daily.   lisinopril  (ZESTRIL ) 5 MG tablet Take 1 tablet (5 mg total) by mouth daily.   MAGNESIUM PO Take 500 mg by mouth daily.   MELATONIN PO Take 1 tablet by mouth at bedtime as needed (sleep).   Multiple Vitamin (MULTIVITAMIN) tablet Take 1 tablet by mouth daily. Unknown strenght (Patient taking differently: Take 1 tablet by mouth daily.)   Omega-3 Fatty Acids (FISH OIL) 645 MG CAPS Take 2 tablets by mouth daily.   Probiotic Product (PROBIOTIC PO) Take 1 tablet by mouth daily.   TURMERIC PO Take 1 tablet by mouth daily.  [2]  Allergies Allergen Reactions   Dust Mite Extract Other (See Comments)    Pollen, dust, and wire- EYES  BURN & tired per pt   "

## 2024-08-25 NOTE — Assessment & Plan Note (Signed)
 History of hyperlipidemia on high-dose atorvastatin  with lipid profile performed 02/20/2024 revealing total cholesterol 162, LDL of 103 and HDL of 34, not at goal for secondary prevention given his mildly elevated coronary calcium  score.  I suspect he will ultimately need to be on Zetia  and/or PCSK9.  I will leave this to the patient and Dr. Bernie, his primary cardiologist, to decide.

## 2024-08-26 DIAGNOSIS — M1612 Unilateral primary osteoarthritis, left hip: Principal | ICD-10-CM | POA: Insufficient documentation

## 2024-08-26 NOTE — Anesthesia Preprocedure Evaluation (Addendum)
 "                                  Anesthesia Evaluation  Patient identified by MRN, date of birth, ID band Patient awake    Reviewed: Allergy & Precautions, NPO status , Patient's Chart, lab work & pertinent test results  Airway Mallampati: III  TM Distance: >3 FB Neck ROM: Full    Dental  (+) Teeth Intact, Dental Advisory Given   Pulmonary neg pulmonary ROS   breath sounds clear to auscultation       Cardiovascular hypertension, + CAD  + dysrhythmias  Rhythm:Regular Rate:Normal     Neuro/Psych  Headaches PSYCHIATRIC DISORDERS Anxiety Depression     Neuromuscular disease    GI/Hepatic negative GI ROS, Neg liver ROS,,,  Endo/Other  negative endocrine ROS    Renal/GU Renal disease     Musculoskeletal  (+) Arthritis ,    Abdominal   Peds  Hematology negative hematology ROS (+)   Anesthesia Other Findings   Reproductive/Obstetrics                              Anesthesia Physical Anesthesia Plan  ASA: 3  Anesthesia Plan: Spinal   Post-op Pain Management: Tylenol  PO (pre-op)* and Toradol  IV (intra-op)*   Induction: Intravenous  PONV Risk Score and Plan: 2 and Ondansetron , Propofol  infusion and Midazolam   Airway Management Planned: Natural Airway and Simple Face Mask  Additional Equipment: None  Intra-op Plan:   Post-operative Plan:   Informed Consent: I have reviewed the patients History and Physical, chart, labs and discussed the procedure including the risks, benefits and alternatives for the proposed anesthesia with the patient or authorized representative who has indicated his/her understanding and acceptance.       Plan Discussed with: CRNA  Anesthesia Plan Comments: (See PAT note from 1/23  Lab Results      Component                Value               Date                      WBC                      6.6                 08/20/2024                HGB                      16.4                 08/20/2024                HCT                      49.7                08/20/2024                MCV                      93.6  08/20/2024                PLT                      278                 08/20/2024           )         Anesthesia Quick Evaluation  "

## 2024-08-26 NOTE — H&P (Signed)
 TOTAL HIP ADMISSION H&P  Patient is admitted for left total hip arthroplasty.  Subjective:  Chief Complaint: left hip pain  HPI: Samuel Blackburn, 59 y.o. male, has a history of pain and functional disability in the left hip(s) due to arthritis and patient has failed non-surgical conservative treatments for greater than 12 weeks to include NSAID's and/or analgesics, corticosteriod injections, flexibility and strengthening excercises, weight reduction as appropriate, and activity modification.  Onset of symptoms was gradual starting a few years ago with gradually worsening course since that time.The patient noted no past surgery on the left hip(s).  Patient currently rates pain in the left hip at 10 out of 10 with activity. Patient has night pain, worsening of pain with activity and weight bearing, trendelenberg gait, pain that interfers with activities of daily living, and pain with passive range of motion. Patient has evidence of subchondral sclerosis, periarticular osteophytes, and joint space narrowing by imaging studies. This condition presents safety issues increasing the risk of falls.  There is no current active infection.  Patient Active Problem List   Diagnosis Date Noted   Unilateral primary osteoarthritis, left hip 08/26/2024   Preoperative clearance 08/25/2024   Elevated hemoglobin A1c 08/05/2024   Elevated ALT measurement 08/05/2024   Vasovagal syncopes 02/20/2024   Family history of colon cancer in father 02/14/2023   Agatston coronary artery calcium  score less than 100 (22.3-64th%) 02/08/2022   Calcification of coronary artery 01/16/2021   Family history of premature coronary artery disease 12/11/2020   Essential hypertension 10/30/2020   First degree AV block 10/30/2020   Vitamin D  deficiency 10/30/2020   Morbid obesity (HCC) 10/30/2020   LVH (left ventricular hypertrophy) 02/15/2020   LAE (left atrial enlargement) 01/2020   Bradycardia 10/07/2012   Hyperlipidemia  10/07/2012   Past Medical History:  Diagnosis Date   Allergies    Anxiety    Arthritis    Blood per rectum 02/14/2023   Bruit    Cervical radiculopathy    Chest pain 10/07/2012   Chicken pox    COVID-19 04/2023   Depression    Dyspnea on exertion 01/16/2021   Erectile dysfunction    Fatty pancreas    Hepatic steatosis    History of fainting spells of unknown cause    Hyperlipemia    Hypertension    IGT (impaired glucose tolerance)    LAE (left atrial enlargement) 01/2020   Markedly dilated    LVH (left ventricular hypertrophy) 02/15/2020   Mild concentric LVH   Migraines    Mitral regurgitation    Polycythemia    Had hematology work-up and condition is benign   Pre-diabetes    Rectal bleeding    Splenomegaly    Mild    Stage 3 chronic kidney disease (HCC)    Vitamin D  deficiency     Past Surgical History:  Procedure Laterality Date   CARDIAC CATHETERIZATION     COLONOSCOPY  20118   WISDOM TOOTH EXTRACTION     years ago    No current facility-administered medications for this encounter.   Current Outpatient Medications  Medication Sig Dispense Refill Last Dose/Taking   ASHWAGANDHA PO Take 1 tablet by mouth daily.   Taking   aspirin EC 81 MG tablet Take 162 mg by mouth at bedtime. Swallow whole.   Taking   atorvastatin  (LIPITOR) 80 MG tablet Take 1 tablet (80 mg total) by mouth at bedtime. 30 tablet 11 Taking   Berberine Chloride (BERBERINE HCI PO) Take 1 tablet by mouth  daily.   Taking   Cholecalciferol 25 MCG (1000 UT) capsule Take 1,000 Units by mouth daily.   Taking   Coenzyme Q10 (CO Q-10) 100 MG CAPS Take 1 capsule by mouth daily.  0 Taking   lisinopril  (ZESTRIL ) 5 MG tablet Take 1 tablet (5 mg total) by mouth daily. 30 tablet 5 Taking   MAGNESIUM PO Take 500 mg by mouth daily.   Taking   MELATONIN PO Take 1 tablet by mouth at bedtime as needed (sleep).   Taking As Needed   Multiple Vitamin (MULTIVITAMIN) tablet Take 1 tablet by mouth daily. Unknown  strenght (Patient taking differently: Take 1 tablet by mouth daily.)   Taking Differently   Omega-3 Fatty Acids (FISH OIL) 645 MG CAPS Take 2 tablets by mouth daily.   Taking   Probiotic Product (PROBIOTIC PO) Take 1 tablet by mouth daily.   Taking   TURMERIC PO Take 1 tablet by mouth daily.   Taking   Allergies[1]  Social History   Tobacco Use   Smoking status: Never   Smokeless tobacco: Never  Substance Use Topics   Alcohol use: Not Currently    Family History  Problem Relation Age of Onset   Stroke Mother    Early death Mother    Depression Mother    Anxiety disorder Mother    Rheumatic fever Mother    Diabetes Father    Heart disease Father    Hyperlipidemia Father    Colon cancer Father    Healthy Brother    Diabetes Paternal Aunt    Depression Maternal Grandmother    Miscarriages / Stillbirths Maternal Grandmother    Hyperlipidemia Maternal Grandfather    Hypertension Maternal Grandfather    Diabetes Paternal Grandmother    COPD Paternal Grandfather    Hearing loss Paternal Grandfather    Heart disease Paternal Grandfather    Iron deficiency Paternal Actor    Healthy Daughter    Healthy Son    Rectal cancer Neg Hx    Stomach cancer Neg Hx    Esophageal cancer Neg Hx      Review of Systems  Objective:  Physical Exam Vitals reviewed.  Constitutional:      Appearance: Normal appearance. He is obese.  HENT:     Head: Normocephalic and atraumatic.  Eyes:     Extraocular Movements: Extraocular movements intact.     Pupils: Pupils are equal, round, and reactive to light.  Cardiovascular:     Rate and Rhythm: Normal rate and regular rhythm.  Pulmonary:     Effort: Pulmonary effort is normal.     Breath sounds: Normal breath sounds.  Abdominal:     Palpations: Abdomen is soft.  Musculoskeletal:     Cervical back: Normal range of motion and neck supple.     Left hip: Tenderness and bony tenderness present. Decreased range of motion. Decreased  strength.  Neurological:     Mental Status: He is alert and oriented to person, place, and time.  Psychiatric:        Behavior: Behavior normal.     Vital signs in last 24 hours:    Labs:   Estimated body mass index is 34.97 kg/m as calculated from the following:   Height as of 08/25/24: 5' 10.5 (1.791 m).   Weight as of 08/25/24: 112.1 kg.   Imaging Review Plain radiographs demonstrate severe degenerative joint disease of the left hip(s). The bone quality appears to be excellent for age and reported activity level.  Assessment/Plan:  End stage arthritis, left hip(s)  The patient history, physical examination, clinical judgement of the provider and imaging studies are consistent with end stage degenerative joint disease of the left hip(s) and total hip arthroplasty is deemed medically necessary. The treatment options including medical management, injection therapy, arthroscopy and arthroplasty were discussed at length. The risks and benefits of total hip arthroplasty were presented and reviewed. The risks due to aseptic loosening, infection, stiffness, dislocation/subluxation,  thromboembolic complications and other imponderables were discussed.  The patient acknowledged the explanation, agreed to proceed with the plan and consent was signed. Patient is being admitted for inpatient treatment for surgery, pain control, PT, OT, prophylactic antibiotics, VTE prophylaxis, progressive ambulation and ADL's and discharge planning.The patient is planning to be discharged home with home health services       [1]  Allergies Allergen Reactions   Dust Mite Extract Other (See Comments)    Pollen, dust, and wire- EYES BURN & tired per pt

## 2024-08-27 ENCOUNTER — Ambulatory Visit: Admitting: Family Medicine

## 2024-08-27 ENCOUNTER — Observation Stay (HOSPITAL_COMMUNITY)
Admission: RE | Admit: 2024-08-27 | Discharge: 2024-08-29 | Disposition: A | Source: Ambulatory Visit | Attending: Orthopaedic Surgery | Admitting: Orthopaedic Surgery

## 2024-08-27 ENCOUNTER — Observation Stay (HOSPITAL_COMMUNITY)

## 2024-08-27 ENCOUNTER — Encounter (HOSPITAL_COMMUNITY): Payer: Self-pay | Admitting: Orthopaedic Surgery

## 2024-08-27 ENCOUNTER — Ambulatory Visit (HOSPITAL_COMMUNITY): Payer: Self-pay | Admitting: Anesthesiology

## 2024-08-27 ENCOUNTER — Other Ambulatory Visit: Payer: Self-pay

## 2024-08-27 ENCOUNTER — Ambulatory Visit (HOSPITAL_COMMUNITY): Payer: Self-pay | Admitting: Medical

## 2024-08-27 ENCOUNTER — Ambulatory Visit (HOSPITAL_COMMUNITY)

## 2024-08-27 ENCOUNTER — Encounter (HOSPITAL_COMMUNITY): Admission: RE | Disposition: A | Payer: Self-pay | Source: Ambulatory Visit | Attending: Orthopaedic Surgery

## 2024-08-27 DIAGNOSIS — I2510 Atherosclerotic heart disease of native coronary artery without angina pectoris: Secondary | ICD-10-CM | POA: Insufficient documentation

## 2024-08-27 DIAGNOSIS — M1612 Unilateral primary osteoarthritis, left hip: Secondary | ICD-10-CM | POA: Diagnosis not present

## 2024-08-27 DIAGNOSIS — Z7982 Long term (current) use of aspirin: Secondary | ICD-10-CM | POA: Insufficient documentation

## 2024-08-27 DIAGNOSIS — F418 Other specified anxiety disorders: Secondary | ICD-10-CM

## 2024-08-27 DIAGNOSIS — I10 Essential (primary) hypertension: Secondary | ICD-10-CM

## 2024-08-27 DIAGNOSIS — I129 Hypertensive chronic kidney disease with stage 1 through stage 4 chronic kidney disease, or unspecified chronic kidney disease: Secondary | ICD-10-CM | POA: Insufficient documentation

## 2024-08-27 DIAGNOSIS — N183 Chronic kidney disease, stage 3 unspecified: Secondary | ICD-10-CM | POA: Insufficient documentation

## 2024-08-27 DIAGNOSIS — Z96642 Presence of left artificial hip joint: Secondary | ICD-10-CM

## 2024-08-27 DIAGNOSIS — Z8616 Personal history of COVID-19: Secondary | ICD-10-CM | POA: Insufficient documentation

## 2024-08-27 DIAGNOSIS — Z79899 Other long term (current) drug therapy: Secondary | ICD-10-CM | POA: Insufficient documentation

## 2024-08-27 LAB — TYPE AND SCREEN
ABO/RH(D): O POS
Antibody Screen: NEGATIVE

## 2024-08-27 LAB — ABO/RH: ABO/RH(D): O POS

## 2024-08-27 MED ORDER — PROPOFOL 10 MG/ML IV BOLUS
INTRAVENOUS | Status: DC | PRN
  Administered 2024-08-27: 10 mg via INTRAVENOUS

## 2024-08-27 MED ORDER — ONDANSETRON HCL 4 MG/2ML IJ SOLN
INTRAMUSCULAR | Status: AC
  Filled 2024-08-27: qty 2

## 2024-08-27 MED ORDER — OXYCODONE HCL 5 MG/5ML PO SOLN: 5.0000 mg | Freq: Once | ORAL | Status: DC | PRN

## 2024-08-27 MED ORDER — TRANEXAMIC ACID-NACL 1000-0.7 MG/100ML-% IV SOLN
INTRAVENOUS | Status: AC
  Filled 2024-08-27: qty 100

## 2024-08-27 MED ORDER — GLYCOPYRROLATE 0.2 MG/ML IJ SOLN
INTRAMUSCULAR | Status: AC
  Filled 2024-08-27: qty 1

## 2024-08-27 MED ORDER — CEFAZOLIN SODIUM-DEXTROSE 2-4 GM/100ML-% IV SOLN
2.0000 g | INTRAVENOUS | Status: AC
  Administered 2024-08-27: 2 g via INTRAVENOUS

## 2024-08-27 MED ORDER — PROPOFOL 500 MG/50ML IV EMUL
INTRAVENOUS | Status: DC | PRN
  Administered 2024-08-27: 100 ug/kg/min via INTRAVENOUS

## 2024-08-27 MED ORDER — ONDANSETRON HCL 4 MG PO TABS: 4.0000 mg | ORAL_TABLET | Freq: Four times a day (QID) | ORAL | Status: DC | PRN

## 2024-08-27 MED ORDER — STERILE WATER FOR IRRIGATION IR SOLN
Status: DC | PRN
  Administered 2024-08-27: 2000 mL

## 2024-08-27 MED ORDER — ACETAMINOPHEN 500 MG PO TABS
ORAL_TABLET | ORAL | Status: AC
  Filled 2024-08-27: qty 2

## 2024-08-27 MED ORDER — DIPHENHYDRAMINE HCL 12.5 MG/5ML PO ELIX: 12.5000 mg | ORAL_SOLUTION | ORAL | Status: DC | PRN

## 2024-08-27 MED ORDER — HYDROMORPHONE HCL 1 MG/ML IJ SOLN: 0.5000 mg | INTRAMUSCULAR | Status: DC | PRN

## 2024-08-27 MED ORDER — CHLORHEXIDINE GLUCONATE 0.12 % MT SOLN
15.0000 mL | Freq: Once | OROMUCOSAL | Status: AC
  Administered 2024-08-27: 15 mL via OROMUCOSAL

## 2024-08-27 MED ORDER — 0.9 % SODIUM CHLORIDE (POUR BTL) OPTIME
TOPICAL | Status: DC | PRN
  Administered 2024-08-27: 1000 mL

## 2024-08-27 MED ORDER — ACETAMINOPHEN 325 MG PO TABS
325.0000 mg | ORAL_TABLET | Freq: Four times a day (QID) | ORAL | Status: DC | PRN
  Administered 2024-08-27: 650 mg via ORAL
  Administered 2024-08-27: 325 mg via ORAL
  Administered 2024-08-28 – 2024-08-29 (×2): 650 mg via ORAL
  Filled 2024-08-27 (×4): qty 2

## 2024-08-27 MED ORDER — FENTANYL CITRATE (PF) 100 MCG/2ML IJ SOLN
INTRAMUSCULAR | Status: DC | PRN
  Administered 2024-08-27 (×2): 50 ug via INTRAVENOUS

## 2024-08-27 MED ORDER — MENTHOL 3 MG MT LOZG: 1.0000 | LOZENGE | OROMUCOSAL | Status: DC | PRN

## 2024-08-27 MED ORDER — EPHEDRINE 5 MG/ML INJ
INTRAVENOUS | Status: AC
  Filled 2024-08-27: qty 5

## 2024-08-27 MED ORDER — OXYCODONE HCL 5 MG PO TABS: 10.0000 mg | ORAL_TABLET | ORAL | Status: DC | PRN

## 2024-08-27 MED ORDER — SODIUM CHLORIDE 0.9 % IV BOLUS
500.0000 mL | Freq: Once | INTRAVENOUS | Status: AC
  Administered 2024-08-27: 500 mL via INTRAVENOUS

## 2024-08-27 MED ORDER — ORAL CARE MOUTH RINSE: 15.0000 mL | Freq: Once | OROMUCOSAL | Status: AC

## 2024-08-27 MED ORDER — LISINOPRIL 5 MG PO TABS: 5.0000 mg | ORAL_TABLET | Freq: Every day | ORAL | Status: DC

## 2024-08-27 MED ORDER — BUPIVACAINE IN DEXTROSE 0.75-8.25 % IT SOLN
INTRATHECAL | Status: DC | PRN
  Administered 2024-08-27: 2 mL via INTRATHECAL

## 2024-08-27 MED ORDER — PHENYLEPHRINE HCL-NACL 20-0.9 MG/250ML-% IV SOLN
INTRAVENOUS | Status: DC | PRN
  Administered 2024-08-27: 25 ug/min via INTRAVENOUS

## 2024-08-27 MED ORDER — OXYCODONE HCL 5 MG PO TABS: 5.0000 mg | ORAL_TABLET | Freq: Once | ORAL | Status: DC | PRN

## 2024-08-27 MED ORDER — HYDROMORPHONE HCL 1 MG/ML IJ SOLN: 0.2500 mg | INTRAMUSCULAR | Status: DC | PRN

## 2024-08-27 MED ORDER — PANTOPRAZOLE SODIUM 40 MG PO TBEC
40.0000 mg | DELAYED_RELEASE_TABLET | Freq: Every day | ORAL | Status: DC
  Administered 2024-08-27 – 2024-08-29 (×3): 40 mg via ORAL
  Filled 2024-08-27 (×3): qty 1

## 2024-08-27 MED ORDER — ACETAMINOPHEN 10 MG/ML IV SOLN: 1000.0000 mg | Freq: Once | INTRAVENOUS | Status: DC | PRN

## 2024-08-27 MED ORDER — LACTATED RINGERS IV SOLN: INTRAVENOUS | Status: DC

## 2024-08-27 MED ORDER — SODIUM CHLORIDE 0.9 % IR SOLN
Status: DC | PRN
  Administered 2024-08-27: 1000 mL

## 2024-08-27 MED ORDER — GABAPENTIN 100 MG PO CAPS
100.0000 mg | ORAL_CAPSULE | Freq: Three times a day (TID) | ORAL | Status: DC
  Administered 2024-08-27 – 2024-08-29 (×5): 100 mg via ORAL
  Filled 2024-08-27 (×5): qty 1

## 2024-08-27 MED ORDER — EPHEDRINE SULFATE (PRESSORS) 25 MG/5ML IV SOSY
PREFILLED_SYRINGE | INTRAVENOUS | Status: DC | PRN
  Administered 2024-08-27 (×2): 5 mg via INTRAVENOUS

## 2024-08-27 MED ORDER — METHOCARBAMOL 1000 MG/10ML IJ SOLN
500.0000 mg | Freq: Four times a day (QID) | INTRAMUSCULAR | Status: DC | PRN
  Administered 2024-08-27: 500 mg via INTRAVENOUS
  Filled 2024-08-27: qty 10

## 2024-08-27 MED ORDER — SODIUM CHLORIDE 0.9 % IV SOLN: INTRAVENOUS | Status: DC

## 2024-08-27 MED ORDER — CEFAZOLIN SODIUM-DEXTROSE 2-4 GM/100ML-% IV SOLN
INTRAVENOUS | Status: AC
  Filled 2024-08-27: qty 100

## 2024-08-27 MED ORDER — SENNA 8.6 MG PO TABS
1.0000 | ORAL_TABLET | Freq: Every day | ORAL | Status: DC
  Administered 2024-08-27 – 2024-08-29 (×3): 8.6 mg via ORAL
  Filled 2024-08-27 (×3): qty 1

## 2024-08-27 MED ORDER — ZOLPIDEM TARTRATE 5 MG PO TABS
5.0000 mg | ORAL_TABLET | Freq: Every evening | ORAL | Status: DC | PRN
  Filled 2024-08-27: qty 1

## 2024-08-27 MED ORDER — CEFAZOLIN SODIUM-DEXTROSE 2-4 GM/100ML-% IV SOLN
2.0000 g | Freq: Four times a day (QID) | INTRAVENOUS | Status: AC
  Administered 2024-08-27 (×2): 2 g via INTRAVENOUS
  Filled 2024-08-27 (×2): qty 100

## 2024-08-27 MED ORDER — ALUM & MAG HYDROXIDE-SIMETH 200-200-20 MG/5ML PO SUSP: 30.0000 mL | ORAL | Status: DC | PRN

## 2024-08-27 MED ORDER — MIDAZOLAM HCL 2 MG/2ML IJ SOLN
INTRAMUSCULAR | Status: AC
  Filled 2024-08-27: qty 2

## 2024-08-27 MED ORDER — METOCLOPRAMIDE HCL 5 MG/ML IJ SOLN: 5.0000 mg | Freq: Three times a day (TID) | INTRAMUSCULAR | Status: DC | PRN

## 2024-08-27 MED ORDER — FENTANYL CITRATE (PF) 100 MCG/2ML IJ SOLN
INTRAMUSCULAR | Status: AC
  Filled 2024-08-27: qty 2

## 2024-08-27 MED ORDER — MIDAZOLAM HCL 5 MG/5ML IJ SOLN
INTRAMUSCULAR | Status: DC | PRN
  Administered 2024-08-27 (×2): 1 mg via INTRAVENOUS

## 2024-08-27 MED ORDER — TRANEXAMIC ACID-NACL 1000-0.7 MG/100ML-% IV SOLN
1000.0000 mg | INTRAVENOUS | Status: AC
  Administered 2024-08-27: 1000 mg via INTRAVENOUS

## 2024-08-27 MED ORDER — ONDANSETRON HCL 4 MG/2ML IJ SOLN: 4.0000 mg | Freq: Four times a day (QID) | INTRAMUSCULAR | Status: DC | PRN

## 2024-08-27 MED ORDER — MEPERIDINE HCL 25 MG/ML IJ SOLN: 6.2500 mg | INTRAMUSCULAR | Status: DC | PRN

## 2024-08-27 MED ORDER — ASPIRIN 81 MG PO CHEW
81.0000 mg | CHEWABLE_TABLET | Freq: Two times a day (BID) | ORAL | Status: DC
  Administered 2024-08-27 – 2024-08-29 (×4): 81 mg via ORAL
  Filled 2024-08-27 (×4): qty 1

## 2024-08-27 MED ORDER — METHOCARBAMOL 500 MG PO TABS
500.0000 mg | ORAL_TABLET | Freq: Four times a day (QID) | ORAL | Status: DC | PRN
  Administered 2024-08-28: 500 mg via ORAL
  Filled 2024-08-27: qty 1

## 2024-08-27 MED ORDER — DROPERIDOL 2.5 MG/ML IJ SOLN: 0.6250 mg | Freq: Once | INTRAMUSCULAR | Status: DC | PRN

## 2024-08-27 MED ORDER — ONDANSETRON HCL 4 MG/2ML IJ SOLN
INTRAMUSCULAR | Status: DC | PRN
  Administered 2024-08-27: 4 mg via INTRAVENOUS

## 2024-08-27 MED ORDER — ATORVASTATIN CALCIUM 20 MG PO TABS
80.0000 mg | ORAL_TABLET | Freq: Every day | ORAL | Status: DC
  Administered 2024-08-27 – 2024-08-28 (×2): 80 mg via ORAL
  Filled 2024-08-27 (×2): qty 4

## 2024-08-27 MED ORDER — PHENOL 1.4 % MT LIQD: 1.0000 | OROMUCOSAL | Status: DC | PRN

## 2024-08-27 MED ORDER — POVIDONE-IODINE 10 % EX SWAB: 2.0000 | Freq: Once | CUTANEOUS | Status: DC

## 2024-08-27 MED ORDER — PROPOFOL 1000 MG/100ML IV EMUL
INTRAVENOUS | Status: AC
  Filled 2024-08-27: qty 100

## 2024-08-27 MED ORDER — OXYCODONE HCL 5 MG PO TABS: 5.0000 mg | ORAL_TABLET | ORAL | Status: DC | PRN

## 2024-08-27 MED ORDER — KETOROLAC TROMETHAMINE 15 MG/ML IJ SOLN
7.5000 mg | Freq: Once | INTRAMUSCULAR | Status: AC
  Administered 2024-08-27: 7.5 mg via INTRAVENOUS
  Filled 2024-08-27: qty 1

## 2024-08-27 MED ORDER — METOCLOPRAMIDE HCL 5 MG PO TABS: 5.0000 mg | ORAL_TABLET | Freq: Three times a day (TID) | ORAL | Status: DC | PRN

## 2024-08-27 NOTE — Progress Notes (Signed)
 Pt desats to 85%  while asleep and o2 sats gets back up to 91-95 while awake. 1 L oxygen given for comfort.

## 2024-08-27 NOTE — Plan of Care (Signed)
" °  Problem: Education: Goal: Knowledge of the prescribed therapeutic regimen will improve Outcome: Progressing   Problem: Bowel/Gastric: Goal: Gastrointestinal status for postoperative course will improve Outcome: Progressing   Problem: Cardiac: Goal: Ability to maintain an adequate cardiac output Outcome: Progressing Goal: Will show no evidence of cardiac arrhythmias Outcome: Progressing   Problem: Nutritional: Goal: Will attain and maintain optimal nutritional status Outcome: Progressing   Problem: Neurological: Goal: Will regain or maintain usual level of consciousness Outcome: Progressing   Problem: Clinical Measurements: Goal: Ability to maintain clinical measurements within normal limits Outcome: Progressing Goal: Postoperative complications will be avoided or minimized Outcome: Progressing   Problem: Respiratory: Goal: Will regain and/or maintain adequate ventilation Outcome: Progressing Goal: Respiratory status will improve Outcome: Progressing   Problem: Skin Integrity: Goal: Demonstrates signs of wound healing without infection Outcome: Progressing   Problem: Urinary Elimination: Goal: Will remain free from infection Outcome: Progressing Goal: Ability to achieve and maintain adequate urine output Outcome: Progressing   Problem: Education: Goal: Knowledge of General Education information will improve Description: Including pain rating scale, medication(s)/side effects and non-pharmacologic comfort measures Outcome: Progressing   Problem: Health Behavior/Discharge Planning: Goal: Ability to manage health-related needs will improve Outcome: Progressing   Problem: Clinical Measurements: Goal: Ability to maintain clinical measurements within normal limits will improve Outcome: Progressing Goal: Will remain free from infection Outcome: Progressing Goal: Diagnostic test results will improve Outcome: Progressing Goal: Respiratory complications will  improve Outcome: Progressing Goal: Cardiovascular complication will be avoided Outcome: Progressing   Problem: Activity: Goal: Risk for activity intolerance will decrease Outcome: Progressing   Problem: Nutrition: Goal: Adequate nutrition will be maintained Outcome: Progressing   Problem: Coping: Goal: Level of anxiety will decrease Outcome: Progressing   Problem: Elimination: Goal: Will not experience complications related to bowel motility Outcome: Progressing Goal: Will not experience complications related to urinary retention Outcome: Progressing   Problem: Pain Managment: Goal: General experience of comfort will improve and/or be controlled Outcome: Progressing   Problem: Safety: Goal: Ability to remain free from injury will improve Outcome: Progressing   Problem: Skin Integrity: Goal: Risk for impaired skin integrity will decrease Outcome: Progressing   Problem: Education: Goal: Knowledge of the prescribed therapeutic regimen will improve Outcome: Progressing Goal: Understanding of discharge needs will improve Outcome: Progressing Goal: Individualized Educational Video(s) Outcome: Progressing   Problem: Activity: Goal: Ability to avoid complications of mobility impairment will improve Outcome: Progressing Goal: Ability to tolerate increased activity will improve Outcome: Progressing   Problem: Clinical Measurements: Goal: Postoperative complications will be avoided or minimized Outcome: Progressing   Problem: Pain Management: Goal: Pain level will decrease with appropriate interventions Outcome: Progressing   Problem: Skin Integrity: Goal: Will show signs of wound healing Outcome: Progressing   "

## 2024-08-27 NOTE — Transfer of Care (Signed)
 Immediate Anesthesia Transfer of Care Note  Patient: Samuel Blackburn  Procedure(s) Performed: ARTHROPLASTY, HIP, TOTAL, ANTERIOR APPROACH (Left: Hip)  Patient Location: PACU  Anesthesia Type:MAC and Spinal  Level of Consciousness: awake, alert , and oriented  Airway & Oxygen Therapy: Patient Spontanous Breathing and Patient connected to face mask oxygen  Post-op Assessment: Report given to RN and Post -op Vital signs reviewed and stable  Post vital signs: Reviewed and stable  Last Vitals:  Vitals Value Taken Time  BP 104/73 08/27/24 08:47  Temp    Pulse 84 08/27/24 08:49  Resp 16 08/27/24 08:49  SpO2 99 % 08/27/24 08:49  Vitals shown include unfiled device data.  Last Pain:  Vitals:   08/27/24 0605  TempSrc:   PainSc: 0-No pain         Complications: No notable events documented.

## 2024-08-27 NOTE — Plan of Care (Signed)
 " Problem: Education: Goal: Knowledge of the prescribed therapeutic regimen will improve 08/27/2024 2049 by Rockford Leander BIRCH, RN Outcome: Progressing 08/27/2024 2049 by [place] Leander BIRCH, RN Outcome: Progressing   Problem: Bowel/Gastric: Goal: Gastrointestinal status for postoperative course will improve 08/27/2024 2049 by Rockford Leander BIRCH, RN Outcome: Progressing 08/27/2024 2049 by [place]ford Leander BIRCH, RN Outcome: Progressing   Problem: Cardiac: Goal: Ability to maintain an adequate cardiac output 08/27/2024 2049 by Rockford Leander BIRCH, RN Outcome: Progressing 08/27/2024 2049 by [place]ford Leander BIRCH, RN Outcome: Progressing Goal: Will show no evidence of cardiac arrhythmias 08/27/2024 2049 by Rockford Leander BIRCH, RN Outcome: Progressing 08/27/2024 2049 by [place]ford Leander D, RN Outcome: Progressing   Problem: Nutritional: Goal: Will attain and maintain optimal nutritional status 08/27/2024 2049 by Rockford Leander BIRCH, RN Outcome: Progressing 08/27/2024 2049 by [place]ford Leander BIRCH, RN Outcome: Progressing   Problem: Neurological: Goal: Will regain or maintain usual level of consciousness 08/27/2024 2049 by Rockford Leander BIRCH, RN Outcome: Progressing 08/27/2024 2049 by Rockford Leander BIRCH, RN Outcome: Progressing   Problem: Clinical Measurements: Goal: Ability to maintain clinical measurements within normal limits 08/27/2024 2049 by Rockford Leander BIRCH, RN Outcome: Progressing 08/27/2024 2049 by Rockford Leander BIRCH, RN Outcome: Progressing Goal: Postoperative complications will be avoided or minimized 08/27/2024 2049 by Rockford Leander BIRCH, RN Outcome: Progressing 08/27/2024 2049 by Rockford Leander D, RN Outcome: Progressing   Problem: Respiratory: Goal: Will regain and/or maintain adequate ventilation 08/27/2024 2049 by Rockford Leander BIRCH, RN Outcome: Progressing 08/27/2024 2049 by [place]ford Leander D, RN Outcome: Progressing Goal: Respiratory status will improve 08/27/2024 2049 by Rockford Leander BIRCH, RN Outcome:  Progressing 08/27/2024 2049 by [place]ford Leander D, RN Outcome: Progressing   Problem: Skin Integrity: Goal: Demonstrates signs of wound healing without infection 08/27/2024 2049 by Rockford Leander BIRCH, RN Outcome: Progressing 08/27/2024 2049 by [place]ford Leander D, RN Outcome: Progressing   Problem: Urinary Elimination: Goal: Will remain free from infection 08/27/2024 2049 by Rockford Leander BIRCH, RN Outcome: Progressing 08/27/2024 2049 by Rockford Leander BIRCH, RN Outcome: Progressing Goal: Ability to achieve and maintain adequate urine output 08/27/2024 2049 by Rockford Leander BIRCH, RN Outcome: Progressing 08/27/2024 2049 by Rockford Leander BIRCH, RN Outcome: Progressing   Problem: Education: Goal: Knowledge of General Education information will improve Description: Including pain rating scale, medication(s)/side effects and non-pharmacologic comfort measures 08/27/2024 2049 by Rockford Leander BIRCH, RN Outcome: Progressing 08/27/2024 2049 by Rockford Leander BIRCH, RN Outcome: Progressing   Problem: Health Behavior/Discharge Planning: Goal: Ability to manage health-related needs will improve 08/27/2024 2049 by Rockford Leander BIRCH, RN Outcome: Progressing 08/27/2024 2049 by Rockford Leander BIRCH, RN Outcome: Progressing   Problem: Clinical Measurements: Goal: Ability to maintain clinical measurements within normal limits will improve 08/27/2024 2049 by Rockford Leander BIRCH, RN Outcome: Progressing 08/27/2024 2049 by Rockford Leander D, RN Outcome: Progressing Goal: Will remain free from infection 08/27/2024 2049 by Rockford Leander BIRCH, RN Outcome: Progressing 08/27/2024 2049 by Rockford Leander D, RN Outcome: Progressing Goal: Diagnostic test results will improve 08/27/2024 2049 by Rockford Leander BIRCH, RN Outcome: Progressing 08/27/2024 2049 by Rockford Leander D, RN Outcome: Progressing Goal: Respiratory complications will improve 08/27/2024 2049 by Rockford Leander BIRCH, RN Outcome: Progressing 08/27/2024 2049 by Rockford Leander D,  RN Outcome: Progressing Goal: Cardiovascular complication will be avoided 08/27/2024 2049 by Rockford Leander BIRCH, RN Outcome: Progressing 08/27/2024 2049 by Rockford Leander BIRCH, RN Outcome: Progressing   Problem: Activity: Goal: Risk for activity intolerance will decrease 08/27/2024 2049 by Rockford Leander BIRCH, RN Outcome: Progressing 08/27/2024 2049  by Rockford Leander BIRCH, RN Outcome: Progressing   Problem: Nutrition: Goal: Adequate nutrition will be maintained 08/27/2024 2049 by Rockford Leander BIRCH, RN Outcome: Progressing 08/27/2024 2049 by Rockford Leander D, RN Outcome: Progressing   Problem: Coping: Goal: Level of anxiety will decrease 08/27/2024 2049 by Rockford Leander BIRCH, RN Outcome: Progressing 08/27/2024 2049 by Rockford Leander BIRCH, RN Outcome: Progressing   Problem: Elimination: Goal: Will not experience complications related to bowel motility 08/27/2024 2049 by Rockford Leander BIRCH, RN Outcome: Progressing 08/27/2024 2049 by Rockford Leander BIRCH, RN Outcome: Progressing Goal: Will not experience complications related to urinary retention 08/27/2024 2049 by Rockford Leander BIRCH, RN Outcome: Progressing 08/27/2024 2049 by Rockford Leander D, RN Outcome: Progressing   Problem: Pain Managment: Goal: General experience of comfort will improve and/or be controlled 08/27/2024 2049 by Rockford Leander BIRCH, RN Outcome: Progressing 08/27/2024 2049 by Rockford Leander D, RN Outcome: Progressing   Problem: Safety: Goal: Ability to remain free from injury will improve 08/27/2024 2049 by Rockford Leander BIRCH, RN Outcome: Progressing 08/27/2024 2049 by Rockford Leander D, RN Outcome: Progressing   Problem: Skin Integrity: Goal: Risk for impaired skin integrity will decrease 08/27/2024 2049 by Rockford Leander BIRCH, RN Outcome: Progressing 08/27/2024 2049 by Rockford Leander D, RN Outcome: Progressing   Problem: Education: Goal: Knowledge of the prescribed therapeutic regimen will improve 08/27/2024 2049 by Rockford Leander BIRCH,  RN Outcome: Progressing 08/27/2024 2049 by Rockford Leander BIRCH, RN Outcome: Progressing Goal: Understanding of discharge needs will improve 08/27/2024 2049 by Rockford Leander BIRCH, RN Outcome: Progressing 08/27/2024 2049 by Rockford Leander BIRCH, RN Outcome: Progressing Goal: Individualized Educational Video(s) 08/27/2024 2049 by Rockford Leander BIRCH, RN Outcome: Progressing 08/27/2024 2049 by Rockford Leander BIRCH, RN Outcome: Progressing   Problem: Activity: Goal: Ability to avoid complications of mobility impairment will improve 08/27/2024 2049 by Rockford Leander BIRCH, RN Outcome: Progressing 08/27/2024 2049 by Rockford Leander BIRCH, RN Outcome: Progressing Goal: Ability to tolerate increased activity will improve 08/27/2024 2049 by Rockford Leander BIRCH, RN Outcome: Progressing 08/27/2024 2049 by Rockford Leander BIRCH, RN Outcome: Progressing   Problem: Clinical Measurements: Goal: Postoperative complications will be avoided or minimized 08/27/2024 2049 by Rockford Leander BIRCH, RN Outcome: Progressing 08/27/2024 2049 by Rockford Leander BIRCH, RN Outcome: Progressing   Problem: Pain Management: Goal: Pain level will decrease with appropriate interventions 08/27/2024 2049 by Rockford Leander BIRCH, RN Outcome: Progressing 08/27/2024 2049 by Rockford Leander D, RN Outcome: Progressing   Problem: Skin Integrity: Goal: Will show signs of wound healing 08/27/2024 2049 by Rockford Leander BIRCH, RN Outcome: Progressing 08/27/2024 2049 by Rockford Leander D, RN Outcome: Progressing   Problem: Pain Managment: Goal: General experience of comfort will improve and/or be controlled 08/27/2024 2049 by Rockford Leander BIRCH, RN Outcome: Progressing 08/27/2024 2049 by Rockford Leander BIRCH, RN Outcome: Progressing   "

## 2024-08-27 NOTE — Evaluation (Signed)
 Physical Therapy Evaluation Patient Details Name: Samuel Blackburn MRN: [id number] DOB: October 13, 1965 Today's Date: 08/27/2024  History of Present Illness  Pt is a 59 year old male s/p Left THA on 08/27/24.  Clinical Impression  Pt is s/p THA resulting in the deficits listed below (see PT Problem List).  Pt will benefit from acute skilled PT to increase their independence and safety with mobility to facilitate discharge.   Pt assisted with ambulating however distance limited by drop in BP (see mobility section).  Pt's RN notified.  Pt and spouse also report worry over oxygen saturations due to their report of issues post surgery/PACU (someone stated pt may have sleep apnea?).  Pt feeling better reclined and legs elevated upon therapist leaving room. Pt anticipates return home with family assist at d/c.  He does have a rollator at home borrowed from a friend, so discussed typically recommend RW however he may stick with rollator.         If plan is discharge home, recommend the following: A little help with walking and/or transfers;A little help with bathing/dressing/bathroom;Assist for transportation;Assistance with cooking/housework;Help with stairs or ramp for entrance   Can travel by private vehicle        Equipment Recommendations Rolling walker (2 wheels) (pt has rollator)  Recommendations for Other Services       Functional Status Assessment Patient has had a recent decline in their functional status and demonstrates the ability to make significant improvements in function in a reasonable and predictable amount of time.     Precautions / Restrictions Precautions Precautions: Fall Restrictions LLE Weight Bearing Per Provider Order: Weight bearing as tolerated      Mobility  Bed Mobility Overal bed mobility: Needs Assistance Bed Mobility: Supine to Sit     Supine to sit: HOB elevated, Used rails, Mod assist     General bed mobility comments: pt requested son provide a hand  for him to pull up trunk; pt required assist for left LE and scooting to EOB    Transfers Overall transfer level: Needs assistance Equipment used: Rolling walker (2 wheels) Transfers: Sit to/from Stand Sit to Stand: Min assist           General transfer comment: verbal cues for UE and LE positioning for pain control    Ambulation/Gait Ambulation/Gait assistance: Contact guard assist Gait Distance (Feet): 12 Feet Assistive device: Rolling walker (2 wheels) Gait Pattern/deviations: Step-to pattern, Antalgic, Decreased stance time - left Gait velocity: decr     General Gait Details: verbal cues for sequence, RW positioning, step length; pt reported increased dizziness/lightheadedness so recliner provided; BP 87/64 (72), HR 75 bpm; pt returned to room in recliner and RN notified  Stairs            Wheelchair Mobility     Tilt Bed    Modified Rankin (Stroke Patients Only)       Balance                                             Pertinent Vitals/Pain Pain Assessment Pain Assessment: 0-10 Pain Score: 3  Pain Location: left hip Pain Descriptors / Indicators: Sore, Aching Pain Intervention(s): Monitored during session, Premedicated before session, Repositioned    Home Living Family/patient expects to be discharged to:: Private residence Living Arrangements: Spouse/significant other Available Help at Discharge: Family Type of Home: House Home  Access: Stairs to enter Entrance Stairs-Rails: None Entrance Stairs-Number of Steps: 6   Home Layout: One level Home Equipment: Rollator (4 wheels)      Prior Function Prior Level of Function : Independent/Modified Independent                     Extremity/Trunk Assessment        Lower Extremity Assessment Lower Extremity Assessment: LLE deficits/detail LLE Deficits / Details: anticipated L hip post op weakness observed, pt able to perform bil ankle pumps       Communication    Communication Communication: No apparent difficulties    Cognition Arousal: Alert Behavior During Therapy: WFL for tasks assessed/performed   PT - Cognitive impairments: No apparent impairments                         Following commands: Intact       Cueing       General Comments      Exercises     Assessment/Plan    PT Assessment Patient needs continued PT services  PT Problem List Decreased strength;Decreased range of motion;Decreased knowledge of use of DME;Decreased activity tolerance;Decreased mobility;Pain       PT Treatment Interventions Stair training;Gait training;DME instruction;Therapeutic exercise;Functional mobility training;Therapeutic activities;Patient/family education    PT Goals (Current goals can be found in the Care Plan section)  Acute Rehab PT Goals PT Goal Formulation: With patient/family Time For Goal Achievement: 09/03/24 Potential to Achieve Goals: Good    Frequency 7X/week     Co-evaluation               AM-PAC PT 6 Clicks Mobility  Outcome Measure Help needed turning from your back to your side while in a flat bed without using bedrails?: A Little Help needed moving from lying on your back to sitting on the side of a flat bed without using bedrails?: A Little Help needed moving to and from a bed to a chair (including a wheelchair)?: A Little Help needed standing up from a chair using your arms (e.g., wheelchair or bedside chair)?: A Little Help needed to walk in hospital room?: A Lot Help needed climbing 3-5 steps with a railing? : A Lot 6 Click Score: 16    End of Session Equipment Utilized During Treatment: Gait belt Activity Tolerance: Patient tolerated treatment well Patient left: in chair;with call bell/phone within reach;with family/visitor present Nurse Communication: Mobility status PT Visit Diagnosis: Difficulty in walking, not elsewhere classified (R26.2)    Time: 8494-8466 PT Time Calculation (min)  (ACUTE ONLY): 28 min   Charges:   PT Evaluation $PT Eval Low Complexity: 1 Low PT Treatments $Gait Training: 8-22 mins PT General Charges $$ ACUTE PT VISIT: 1 Visit       Tari KLEIN, DPT Physical Therapist Acute Rehabilitation Services Office: [phone]   Tari CROME [place] 08/27/2024, 4:01 PM

## 2024-08-27 NOTE — Interval H&P Note (Signed)
 History and Physical Interval Note: The patient understands that he is here today for a left total hip replacement to treat his significant left hip pain and arthritis.  There has been no acute or interval change in his medical status.  The risks and benefits of surgery have been discussed in detail and informed consent has been obtained.  The left operative hip has been marked.  08/27/2024 6:57 AM  Samuel Blackburn  has presented today for surgery, with the diagnosis of osteoarthritis left hip.  The various methods of treatment have been discussed with the patient and family. After consideration of risks, benefits and other options for treatment, the patient has consented to  Procedures: ARTHROPLASTY, HIP, TOTAL, ANTERIOR APPROACH (Left) as a surgical intervention.  The patient's history has been reviewed, patient examined, no change in status, stable for surgery.  I have reviewed the patient's chart and labs.  Questions were answered to the patient's satisfaction.     Samuel Blackburn

## 2024-08-27 NOTE — Anesthesia Procedure Notes (Signed)
 Procedure Name: MAC Date/Time: 08/27/2024 7:12 AM  Performed by: Zulema Leita PARAS, CRNAPre-anesthesia Checklist: Patient identified, Emergency Drugs available, Suction available and Patient being monitored Oxygen Delivery Method: Simple face mask

## 2024-08-27 NOTE — Anesthesia Postprocedure Evaluation (Signed)
"   Anesthesia Post Note  Patient: Samuel Blackburn  Procedure(s) Performed: ARTHROPLASTY, HIP, TOTAL, ANTERIOR APPROACH (Left: Hip)     Patient location during evaluation: PACU Anesthesia Type: Spinal Level of consciousness: oriented and awake and alert Pain management: pain level controlled Vital Signs Assessment: post-procedure vital signs reviewed and stable Respiratory status: spontaneous breathing, respiratory function stable and patient connected to nasal cannula oxygen Cardiovascular status: blood pressure returned to baseline and stable Postop Assessment: no headache, no backache and no apparent nausea or vomiting Anesthetic complications: no   No notable events documented.  Last Vitals:  Vitals:   08/27/24 1342 08/27/24 1346  BP: (!) 98/58 102/66  Pulse: 65 63  Resp: 19 19  Temp: 36.8 C   SpO2: 97% 97%    Last Pain:  Vitals:   08/27/24 1342  TempSrc: Oral  PainSc:                  [name]      "

## 2024-08-28 ENCOUNTER — Encounter: Payer: Self-pay | Admitting: Family Medicine

## 2024-08-28 DIAGNOSIS — G4734 Idiopathic sleep related nonobstructive alveolar hypoventilation: Secondary | ICD-10-CM

## 2024-08-28 LAB — BASIC METABOLIC PANEL WITH GFR
Anion gap: 12 (ref 5–15)
BUN: 17 mg/dL (ref 6–20)
CO2: 24 mmol/L (ref 22–32)
Calcium: 9.4 mg/dL (ref 8.9–10.3)
Chloride: 100 mmol/L (ref 98–111)
Creatinine, Ser: 1.42 mg/dL — ABNORMAL HIGH (ref 0.61–1.24)
GFR, Estimated: 58 mL/min — ABNORMAL LOW
Glucose, Bld: 127 mg/dL — ABNORMAL HIGH (ref 70–99)
Potassium: 4.1 mmol/L (ref 3.5–5.1)
Sodium: 135 mmol/L (ref 135–145)

## 2024-08-28 LAB — CBC
HCT: 43.1 % (ref 39.0–52.0)
Hemoglobin: 14.4 g/dL (ref 13.0–17.0)
MCH: 30.8 pg (ref 26.0–34.0)
MCHC: 33.4 g/dL (ref 30.0–36.0)
MCV: 92.3 fL (ref 80.0–100.0)
Platelets: 234 10*3/uL (ref 150–400)
RBC: 4.67 MIL/uL (ref 4.22–5.81)
RDW: 12.6 % (ref 11.5–15.5)
WBC: 9.7 10*3/uL (ref 4.0–10.5)
nRBC: 0 % (ref 0.0–0.2)

## 2024-08-28 MED ORDER — METHOCARBAMOL 500 MG PO TABS: 500.0000 mg | ORAL_TABLET | Freq: Four times a day (QID) | ORAL | 0 refills | Status: AC | PRN

## 2024-08-28 MED ORDER — OXYCODONE HCL 5 MG PO TABS: 5.0000 mg | ORAL_TABLET | ORAL | 0 refills | Status: AC | PRN

## 2024-08-28 MED ORDER — ASPIRIN 81 MG PO CHEW: 81.0000 mg | CHEWABLE_TABLET | Freq: Two times a day (BID) | ORAL | 0 refills | Status: AC

## 2024-08-28 NOTE — TOC Progression Note (Signed)
 Transition of Care [facility]) - Progression Note    Patient Details  Name: Samuel Blackburn MRN: [id number] Date of Birth: 21-Dec-1965  Transition of [facility]) CM/SW Contact  Sonda Manuella Quill, RN Phone Number: 08/28/2024, 2:17 PM  Clinical Narrative:    Orders received for home oxygen; spoke w/ pt and Kristi, RN over phone; discussed qualifying documents need, and oxygen cannot be purchased independently; discussed need amb sat note required for home oxygen, and pt will likely need OP sleep study done; see RT and MD notes; awaiting qualifying sat note.     Barriers to Discharge: No Barriers Identified               Expected Discharge Plan and Services                         DME Arranged: Walker rolling DME Agency: [employer] Date DME Agency Contacted: 08/28/24     HH Arranged: PT HH Agency: [facility]         Social Drivers of Health (SDOH) Interventions SDOH Screenings   Food Insecurity: No Food Insecurity (08/27/2024)  Housing: Low Risk (08/27/2024)  Transportation Needs: No Transportation Needs (08/27/2024)  Utilities: Not At Risk (08/27/2024)  Depression (PHQ2-9): Medium Risk (08/05/2024)  Tobacco Use: Low Risk (08/27/2024)    Readmission Risk Interventions     No data to display

## 2024-08-28 NOTE — Discharge Instructions (Signed)
 Per [facility] clinic policy, our goal is ensure optimal postoperative pain control with a multimodal pain management strategy. For all [facility] patients, our goal is to wean post-operative narcotic medications by 6 weeks post-operatively. If this is not possible due to utilization of pain medication prior to surgery, your [facility] doctor will support your acute post-operative pain control for the first 6 weeks postoperatively, with a plan to transition you back to your primary pain team following that. Samuel Blackburn will work to ensure a Therapist, occupational.  INSTRUCTIONS AFTER JOINT REPLACEMENT   Remove items at home which could result in a fall. This includes throw rugs or furniture in walking pathways ICE to the affected joint every three hours while awake for 30 minutes at a time, for at least the first 3-5 days, and then as needed for pain and swelling.  Continue to use ice for pain and swelling. You may notice swelling that will progress down to the foot and ankle.  This is normal after surgery.  Elevate your leg when you are not up walking on it.   Continue to use the breathing machine you got in the hospital (incentive spirometer) which will help keep your temperature down.  It is common for your temperature to cycle up and down following surgery, especially at night when you are not up moving around and exerting yourself.  The breathing machine keeps your lungs expanded and your temperature down.   DIET:  As you were doing prior to hospitalization, we recommend a well-balanced diet.  DRESSING / WOUND CARE / SHOWERING  Keep the surgical dressing until follow up.  The dressing is water  proof, so you can shower without any extra covering.  IF THE DRESSING FALLS OFF or the wound gets wet inside, change the dressing with sterile gauze.  Please use good hand washing techniques before changing the dressing.  Do not use any lotions or creams on the incision until instructed by your surgeon.     ACTIVITY  Increase activity slowly as tolerated, but follow the weight bearing instructions below.   No driving for 6 weeks or until further direction given by your physician.  You cannot drive while taking narcotics.  No lifting or carrying greater than 10 lbs. until further directed by your surgeon. Avoid periods of inactivity such as sitting longer than an hour when not asleep. This helps prevent blood clots.  You may return to work once you are authorized by your doctor.     WEIGHT BEARING   Weight bearing as tolerated with assist device (walker, cane, etc) as directed, use it as long as suggested by your surgeon or therapist, typically at least 4-6 weeks.   EXERCISES  Results after joint replacement surgery are often greatly improved when you follow the exercise, range of motion and muscle strengthening exercises prescribed by your doctor. Safety measures are also important to protect the joint from further injury. Any time any of these exercises cause you to have increased pain or swelling, decrease what you are doing until you are comfortable again and then slowly increase them. If you have problems or questions, call your caregiver or physical therapist for advice.   Rehabilitation is important following a joint replacement. After just a few days of immobilization, the muscles of the leg can become weakened and shrink (atrophy).  These exercises are designed to build up the tone and strength of the thigh and leg muscles and to improve motion. Often times heat used for twenty to thirty minutes before  working out will loosen up your tissues and help with improving the range of motion but do not use heat for the first two weeks following surgery (sometimes heat can increase post-operative swelling).   These exercises can be done on a training (exercise) mat, on the floor, on a table or on a bed. Use whatever works the best and is most comfortable for you.    Use music or television  while you are exercising so that the exercises are a pleasant break in your day. This will make your life better with the exercises acting as a break in your routine that you can look forward to.   Perform all exercises about fifteen times, three times per day or as directed.  You should exercise both the operative leg and the other leg as well.  Exercises include:   Quad Sets - Tighten up the muscle on the front of the thigh (Quad) and hold for 5-10 seconds.   Straight Leg Raises - With your knee straight (if you were given a brace, keep it on), lift the leg to 60 degrees, hold for 3 seconds, and slowly lower the leg.  Perform this exercise against resistance later as your leg gets stronger.  Leg Slides: Lying on your back, slowly slide your foot toward your buttocks, bending your knee up off the floor (only go as far as is comfortable). Then slowly slide your foot back down until your leg is flat on the floor again.  Angel Wings: Lying on your back spread your legs to the side as far apart as you can without causing discomfort.  Hamstring Strength:  Lying on your back, push your heel against the floor with your leg straight by tightening up the muscles of your buttocks.  Repeat, but this time bend your knee to a comfortable angle, and push your heel against the floor.  You may put a pillow under the heel to make it more comfortable if necessary.   A rehabilitation program following joint replacement surgery can speed recovery and prevent re-injury in the future due to weakened muscles. Contact your doctor or a physical therapist for more information on knee rehabilitation.    CONSTIPATION  Constipation is defined medically as fewer than three stools per week and severe constipation as less than one stool per week.  Even if you have a regular bowel pattern at home, your normal regimen is likely to be disrupted due to multiple reasons following surgery.  Combination of anesthesia, postoperative  narcotics, change in appetite and fluid intake all can affect your bowels.   YOU MUST use at least one of the following options; they are listed in order of increasing strength to get the job done.  They are all available over the counter, and you may need to use some, POSSIBLY even all of these options:    Drink plenty of fluids (prune juice may be helpful) and high fiber foods Colace 100 mg by mouth twice a day  Senokot for constipation as directed and as needed Dulcolax (bisacodyl), take with full glass of water  Miralax (polyethylene glycol) once or twice a day as needed.  If you have tried all these things and are unable to have a bowel movement in the first 3-4 days after surgery call either your surgeon or your primary doctor.    If you experience loose stools or diarrhea, hold the medications until you stool forms back up.  If your symptoms do not get better within 1 week  or if they get worse, check with your doctor.  If you experience the worst abdominal pain ever or develop nausea or vomiting, please contact the office immediately for further recommendations for treatment.   ITCHING:  If you experience itching with your medications, try taking only a single pain pill, or even half a pain pill at a time.  You can also use Benadryl  over the counter for itching or also to help with sleep.   TED HOSE STOCKINGS:  Use stockings on both legs until for at least 2 weeks or as directed by physician office. They may be removed at night for sleeping.  MEDICATIONS:  See your medication summary on the "After Visit Summary" that nursing will review with you.  You may have some home medications which will be placed on hold until you complete the course of blood thinner medication.  It is important for you to complete the blood thinner medication as prescribed.  PRECAUTIONS:  If you experience chest pain or shortness of breath - call 911 immediately for transfer to the hospital emergency department.    If you develop a fever greater that 101 F, purulent drainage from wound, increased redness or drainage from wound, foul odor from the wound/dressing, or calf pain - CONTACT YOUR SURGEON.                                                   FOLLOW-UP APPOINTMENTS:  If you do not already have a post-op appointment, please call the office for an appointment to be seen by your surgeon.  Guidelines for how soon to be seen are listed in your "After Visit Summary", but are typically between 1-4 weeks after surgery.  OTHER INSTRUCTIONS:   Knee Replacement:  Do not place pillow under knee, focus on keeping the knee straight while resting. CPM instructions: 0-90 degrees, 2 hours in the morning, 2 hours in the afternoon, and 2 hours in the evening. Place foam block, curve side up under heel at all times except when in CPM or when walking.  DO NOT modify, tear, cut, or change the foam block in any way.  POST-OPERATIVE OPIOID TAPER INSTRUCTIONS: It is important to wean off of your opioid medication as soon as possible. If you do not need pain medication after your surgery it is ok to stop day one. Opioids include: Codeine, Hydrocodone(Norco, Vicodin), Oxycodone (Percocet, oxycontin ) and hydromorphone  amongst others.  Long term and even short term use of opiods can cause: Increased pain response Dependence Constipation Depression Respiratory depression And more.  Withdrawal symptoms can include Flu like symptoms Nausea, vomiting And more Techniques to manage these symptoms Hydrate well Eat regular healthy meals Stay active Use relaxation techniques(deep breathing, meditating, yoga) Do Not substitute Alcohol to help with tapering If you have been on opioids for less than two weeks and do not have pain than it is ok to stop all together.  Plan to wean off of opioids This plan should start within one week post op of your joint replacement. Maintain the same interval or time between taking each dose  and first decrease the dose.  Cut the total daily intake of opioids by one tablet each day Next start to increase the time between doses. The last dose that should be eliminated is the evening dose.   MAKE SURE YOU:  Understand these instructions.  Get help right away if you are not doing well or get worse.    Thank you for letting us  be a part of your medical care team.  It is a privilege we respect greatly.  We hope these instructions will help you stay on track for a fast and full recovery!     Dental Antibiotics:  In most cases prophylactic antibiotics for Dental procdeures after total joint surgery are not necessary.  Exceptions are as follows:  1. History of prior total joint infection  2. Severely immunocompromised (Organ Transplant, cancer chemotherapy, Rheumatoid biologic meds such as Humera)  3. Poorly controlled diabetes (A1C &gt; 8.0, blood glucose over 200)  If you have one of these conditions, contact your surgeon for an antibiotic prescription, prior to your dental procedure.

## 2024-08-28 NOTE — Plan of Care (Signed)
" °  Problem: Education: Goal: Knowledge of the prescribed therapeutic regimen will improve Outcome: Progressing   Problem: Bowel/Gastric: Goal: Gastrointestinal status for postoperative course will improve Outcome: Progressing   Problem: Cardiac: Goal: Ability to maintain an adequate cardiac output Outcome: Progressing Goal: Will show no evidence of cardiac arrhythmias Outcome: Progressing   "

## 2024-08-28 NOTE — Procedures (Signed)
 NOT ABLE TO DO OVERNIGHT PULSE OXIMETRY STUDY DUE TO ONLY HAVING ONE MACHINE AVAILABLE , AND ANOTHER PERSON HAD AN ORDER THAT WAS PUT IN PREVIOUSLY TO THIS ORDER. THE OVERNIGHT WILL BE DONE ON THE NIGHT OF 08/28/24.

## 2024-08-28 NOTE — Progress Notes (Signed)
 Physical Therapy Treatment Patient Details Name: Samuel Blackburn MRN: [id number] DOB: 02-Apr-1966 Today's Date: 08/28/2024   History of Present Illness Pt is a 59 year old male s/p Left THA on 08/27/24.    PT Comments  Pt performed ambulation and practiced safe stair technique.  Pt provided with HEP and stair handouts. SPO2 remained 98-99% on room air throughout session.  HR briefly increased to 116 bpm after performing stairs however 99 bpm upon end of session.  Pt is ready for d/c from PT standpoint.     If plan is discharge home, recommend the following: A little help with walking and/or transfers;A little help with bathing/dressing/bathroom;Assist for transportation;Assistance with cooking/housework;Help with stairs or ramp for entrance   Can travel by private vehicle        Equipment Recommendations  Rolling walker (2 wheels)    Recommendations for Other Services       Precautions / Restrictions Precautions Precautions: Fall Restrictions LLE Weight Bearing Per Provider Order: Weight bearing as tolerated     Mobility  Bed Mobility Overal bed mobility: Needs Assistance Bed Mobility: Sit to Supine       Sit to supine: Contact guard assist, HOB elevated   General bed mobility comments: pt sitting EOB on arrival    Transfers Overall transfer level: Needs assistance Equipment used: Rolling walker (2 wheels) Transfers: Sit to/from Stand Sit to Stand: Supervision           General transfer comment: verbal cues for UE and LE positioning for pain control    Ambulation/Gait Ambulation/Gait assistance: Contact guard assist, Supervision Gait Distance (Feet): 120 Feet Assistive device: Rolling walker (2 wheels) Gait Pattern/deviations: Step-to pattern, Antalgic, Decreased stance time - left Gait velocity: decr     General Gait Details: monitored SPO2 and 98-99% on room air during ambulation, a couple cues provided for posture   Stairs Stairs: Yes Stairs  assistance: Contact guard assist Stair Management: Step to pattern, Backwards, With walker Number of Stairs: 2 General stair comments: verbal cues for sequence and RW positioning, performed with son holding RW, performed twice; provided handout on stair technique   Wheelchair Mobility     Tilt Bed    Modified Rankin (Stroke Patients Only)       Balance                                            Communication Communication Communication: No apparent difficulties  Cognition Arousal: Alert Behavior During Therapy: WFL for tasks assessed/performed   PT - Cognitive impairments: No apparent impairments                         Following commands: Intact      Cueing    Exercises    General Comments        Pertinent Vitals/Pain Pain Assessment Pain Assessment: 0-10 Pain Score: 6  Pain Location: left hip Pain Descriptors / Indicators: Sore, Aching Pain Intervention(s): Monitored during session, Repositioned    Home Living                          Prior Function            PT Goals (current goals can now be found in the care plan section) Progress towards PT goals: Progressing toward goals  Frequency    7X/week      PT Plan      Co-evaluation              AM-PAC PT 6 Clicks Mobility   Outcome Measure  Help needed turning from your back to your side while in a flat bed without using bedrails?: A Little Help needed moving from lying on your back to sitting on the side of a flat bed without using bedrails?: A Little Help needed moving to and from a bed to a chair (including a wheelchair)?: A Little Help needed standing up from a chair using your arms (e.g., wheelchair or bedside chair)?: A Little Help needed to walk in hospital room?: A Little Help needed climbing 3-5 steps with a railing? : A Little 6 Click Score: 18    End of Session Equipment Utilized During Treatment: Gait belt Activity Tolerance:  Patient tolerated treatment well Patient left: in bed;with call bell/phone within reach;with family/visitor present (sitting EOB with multiple family members in room) Nurse Communication: Mobility status PT Visit Diagnosis: Difficulty in walking, not elsewhere classified (R26.2)     Time: 8762-8743 PT Time Calculation (min) (ACUTE ONLY): 19 min  Charges:    $Gait Training: 8-22 mins  PT General Charges $$ ACUTE PT VISIT: 1 Visit                     Tari KLEIN, DPT Physical Therapist Acute Rehabilitation Services Office: [phone]    Tari CROME Payson 08/28/2024, 1:31 PM

## 2024-08-28 NOTE — Progress Notes (Signed)
 Physical Therapy Treatment Patient Details Name: Samuel Blackburn MRN: [id number] DOB: 1965/11/10 Today's Date: 08/28/2024   History of Present Illness Pt is a 59 year old male s/p Left THA on 08/27/24.    PT Comments  Pt has been ambulating with son this morning.  Pt educated on LE exercises.  Pt with pending d/c.  Will return to practice steps if in case of d/c home today.     If plan is discharge home, recommend the following: A little help with walking and/or transfers;A little help with bathing/dressing/bathroom;Assist for transportation;Assistance with cooking/housework;Help with stairs or ramp for entrance   Can travel by private vehicle        Equipment Recommendations  Rolling walker (2 wheels)    Recommendations for Other Services       Precautions / Restrictions Precautions Precautions: Fall Restrictions LLE Weight Bearing Per Provider Order: Weight bearing as tolerated     Mobility  Bed Mobility Overal bed mobility: Needs Assistance Bed Mobility: Sit to Supine       Sit to supine: Contact guard assist, HOB elevated   General bed mobility comments: increased time and effort however pt utilized gait belt to self assist    Transfers Overall transfer level: Needs assistance Equipment used: Rolling walker (2 wheels) Transfers: Sit to/from Stand Sit to Stand: Supervision           General transfer comment: verbal cues for UE and LE positioning for pain control    Ambulation/Gait Ambulation/Gait assistance: Supervision Gait Distance (Feet): 20 Feet Assistive device: Rolling walker (2 wheels) Gait Pattern/deviations: Step-to pattern, Antalgic, Decreased stance time - left       General Gait Details: observed 20 ft of ambulation as pt and son walking in hallway upon arrival, pt reports ambulating multiple times last night and this morning   Stairs             Wheelchair Mobility     Tilt Bed    Modified Rankin (Stroke Patients Only)        Balance                                            Communication Communication Communication: No apparent difficulties  Cognition Arousal: Alert Behavior During Therapy: WFL for tasks assessed/performed   PT - Cognitive impairments: No apparent impairments                         Following commands: Intact      Cueing    Exercises Total Joint Exercises Ankle Circles/Pumps: AROM, Both, 10 reps Quad Sets: AROM, 10 reps, Both Heel Slides: AAROM, Supine, Left, 10 reps Hip ABduction/ADduction: AROM, AAROM, Supine, Standing, Left, 10 reps Long Arc Quad: AROM, Left, Seated, 10 reps Knee Flexion: AROM, Left, 10 reps, Standing Marching in Standing: AROM, Left, 10 reps, Standing Standing Hip Extension: AROM, Left, 10 reps, Standing    General Comments        Pertinent Vitals/Pain Pain Assessment Pain Assessment: 0-10 Pain Score: 6  Pain Location: left hip Pain Descriptors / Indicators: Sore, Aching Pain Intervention(s): Repositioned, Monitored during session    Home Living                          Prior Function  PT Goals (current goals can now be found in the care plan section) Progress towards PT goals: Progressing toward goals    Frequency    7X/week      PT Plan      Co-evaluation              AM-PAC PT 6 Clicks Mobility   Outcome Measure  Help needed turning from your back to your side while in a flat bed without using bedrails?: A Little Help needed moving from lying on your back to sitting on the side of a flat bed without using bedrails?: A Little Help needed moving to and from a bed to a chair (including a wheelchair)?: A Little Help needed standing up from a chair using your arms (e.g., wheelchair or bedside chair)?: A Little Help needed to walk in hospital room?: A Little Help needed climbing 3-5 steps with a railing? : A Little 6 Click Score: 18    End of Session Equipment  Utilized During Treatment: Gait belt Activity Tolerance: Patient tolerated treatment well Patient left: with call bell/phone within reach;with family/visitor present;in bed Nurse Communication: Mobility status PT Visit Diagnosis: Difficulty in walking, not elsewhere classified (R26.2)     Time: 8964-8942 PT Time Calculation (min) (ACUTE ONLY): 22 min  Charges:    $Therapeutic Exercise: 8-22 mins PT General Charges $$ ACUTE PT VISIT: 1 Visit                     Tari KLEIN, DPT Physical Therapist Acute Rehabilitation Services Office: [phone]    Kati L Payson 08/28/2024, 12:01 PM

## 2024-08-28 NOTE — Progress Notes (Signed)
 Patient ID: Samuel Blackburn, male   DOB: [birth year], 59 y.o.   MRN: [id number] If the patient can have home O2 set up, he can be discharged this afternoon if that is possible.  The nurses can just put in a voice order for discharge.  Medications have been sent into his pharmacy.  It is also okay for him to stay.

## 2024-08-28 NOTE — Progress Notes (Signed)
 Subjective: 1 Day Post-Op Procedures (LRB): ARTHROPLASTY, HIP, TOTAL, ANTERIOR APPROACH (Left) Patient reports pain as moderate.  He did have a rough evening last night and was able to walk around with his walker and some of this was due to pain and some of this was due to his low O2 sats with likely history of sleep apnea.  He has not had a workup for sleep apnea before but it sounds like he has a high likelihood of having sleep apnea with low O2 sats in the evening.  Objective: Vital signs in last 24 hours: Temp:  [97.8 F (36.6 C)-98.4 F (36.9 C)] 98.1 F (36.7 C) (01/31 0407) Pulse Rate:  [56-78] 70 (01/31 0407) Resp:  [11-20] 18 (01/31 0407) BP: (98-133)/(49-78) 119/75 (01/31 0407) SpO2:  [94 %-100 %] 98 % (01/31 0407) Weight:  [109.5 kg] 109.5 kg (01/30 1159)  Intake/Output from previous day: 01/30 0701 - 01/31 0700 In: 2363.8 [P.O.:540; I.V.:1434.1; IV Piggyback:389.6] Out: 1625 [Urine:1475; Blood:150] Intake/Output this shift: No intake/output data recorded.  Recent Labs    08/28/24 0537  HGB 14.4   Recent Labs    08/28/24 0537  WBC 9.7  RBC 4.67  HCT 43.1  PLT 234   Recent Labs    08/28/24 0537  NA 135  K 4.1  CL 100  CO2 24  BUN 17  CREATININE 1.42*  GLUCOSE 127*  CALCIUM  9.4   No results for input(s): LABPT, INR in the last 72 hours.  Sensation intact distally Intact pulses distally Dorsiflexion/Plantar flexion intact Incision: dressing C/D/I   Assessment/Plan: 1 Day Post-Op Procedures (LRB): ARTHROPLASTY, HIP, TOTAL, ANTERIOR APPROACH (Left) Up with therapy  The concern with sending him home is that he does not have any oxygen at home and has not had a workup for sleep apnea.  There is some anxiety associated with having significant low O2 sats at night and the need for narcotics which could lower his respiratory drive.  I certainly understand that concern.  With that being said, we need to have him in the hospital until we can sort out  what may be needed at home temporarily prior to having an outpatient sleep study.  We can look into seeing whether or not he can have temporary O2 with oxygen and nasal cannula in the evenings if that is a possibility.     Lonni CINDERELLA Poli 08/28/2024, 9:51 AM

## 2024-08-28 NOTE — TOC Progression Note (Signed)
 Transition of Care [facility]) - Progression Note    Patient Details  Name: DALYN BECKER MRN: [id number] Date of Birth: 1965-09-04  Transition of Care [facility] CM/SW Contact  NORMAN ASPEN, LCSW Phone Number: 08/28/2024, 11:44 AM  Clinical Narrative:     Met with pt and confirming need for RW - no agency preference - referral to [employer] and item delivered to room.  Pt aware that HHPT prearranged with [facility].  Currently discussion about possible need for O2 at home but will need required documentation to place order.  RN aware.    Barriers to Discharge: No Barriers Identified               Expected Discharge Plan and Services                         DME Arranged: Walker rolling DME Agency: [employer] Date DME Agency Contacted: 08/28/24     HH Arranged: PT HH Agency: [facility]         Social Drivers of Health (SDOH) Interventions SDOH Screenings   Food Insecurity: No Food Insecurity (08/27/2024)  Housing: Low Risk (08/27/2024)  Transportation Needs: No Transportation Needs (08/27/2024)  Utilities: Not At Risk (08/27/2024)  Depression (PHQ2-9): Medium Risk (08/05/2024)  Tobacco Use: Low Risk (08/27/2024)    Readmission Risk Interventions     No data to display

## 2024-08-29 NOTE — TOC Transition Note (Signed)
 Transition of Care [facility]) - Discharge Note   Patient Details  Name: Samuel Blackburn MRN: [id number] Date of Birth: 24-Apr-1966  Transition of Care [facility]) CM/SW Contact:  Sonda Manuella Quill, RN Phone Number: 08/29/2024, 5:41 PM   Clinical Narrative:    D/C orders received; no IP CM needs.   Final next level of care: Home w Home Health Services Barriers to Discharge: No Barriers Identified   Patient Goals and CMS Choice Patient states their goals for this hospitalization and ongoing recovery are:: return home          Discharge Placement                       Discharge Plan and Services Additional resources added to the After Visit Summary for                  DME Arranged: Walker rolling DME Agency: [employer] Date DME Agency Contacted: 08/28/24     HH Arranged: PT HH Agency: [facility]        Social Drivers of Health (SDOH) Interventions SDOH Screenings   Food Insecurity: No Food Insecurity (08/27/2024)  Housing: Low Risk (08/27/2024)  Transportation Needs: No Transportation Needs (08/27/2024)  Utilities: Not At Risk (08/27/2024)  Depression (PHQ2-9): Medium Risk (08/05/2024)  Tobacco Use: Low Risk (08/27/2024)     Readmission Risk Interventions     No data to display

## 2024-08-29 NOTE — Progress Notes (Signed)
 Subjective: 2 Days Post-Op Procedures (LRB): ARTHROPLASTY, HIP, TOTAL, ANTERIOR APPROACH (Left) Patient reports pain as moderate.  Oxygen sats improved overnight, not dipping below the high 80s  Objective: Vital signs in last 24 hours: Temp:  [97.7 F (36.5 C)-98.9 F (37.2 C)] 98.7 F (37.1 C) (02/01 0436) Pulse Rate:  [78-101] 78 (02/01 0436) Resp:  [18] 18 (02/01 0436) BP: (127-136)/(75-89) 136/75 (02/01 0436) SpO2:  [94 %-100 %] 94 % (02/01 0436)  Intake/Output from previous day: 01/31 0701 - 02/01 0700 In: 720 [P.O.:720] Out: -  Intake/Output this shift: No intake/output data recorded.  Recent Labs    08/28/24 0537  HGB 14.4   Recent Labs    08/28/24 0537  WBC 9.7  RBC 4.67  HCT 43.1  PLT 234   Recent Labs    08/28/24 0537  NA 135  K 4.1  CL 100  CO2 24  BUN 17  CREATININE 1.42*  GLUCOSE 127*  CALCIUM  9.4   No results for input(s): LABPT, INR in the last 72 hours.  Sensation intact distally Intact pulses distally Dorsiflexion/Plantar flexion intact Incision: dressing C/D/I   Assessment/Plan: 2 Days Post-Op Procedures (LRB): ARTHROPLASTY, HIP, TOTAL, ANTERIOR APPROACH (Left) Up with therapy  The concern with sending him home is that he does not have any oxygen at home and has not had a workup for sleep apnea. From  my standpoint, he may DC home with oxygen but also may benefit from additonal night of monitoring with definitive plan for outpatient sleep study.    Britt Petroni 08/29/2024, 8:47 AM

## 2024-08-29 NOTE — Progress Notes (Signed)
 Physical Therapy Treatment Patient Details Name: Samuel Blackburn MRN: [id number] DOB: 1966-01-16 Today's Date: 08/29/2024   History of Present Illness Pt is a 59 year old male s/p Left THA on 08/27/24.    PT Comments  Pt performed LE exercises and ambulated in hallway.  Pt encouraged to continue ambulating frequent short distances with family and perform HEP again this afternoon.  Pt ready for d/c from PT standpoint.    If plan is discharge home, recommend the following: A little help with walking and/or transfers;A little help with bathing/dressing/bathroom;Assist for transportation;Assistance with cooking/housework;Help with stairs or ramp for entrance   Can travel by private vehicle        Equipment Recommendations  Rolling walker (2 wheels)    Recommendations for Other Services       Precautions / Restrictions Precautions Precautions: Fall Restrictions LLE Weight Bearing Per Provider Order: Weight bearing as tolerated     Mobility  Bed Mobility               General bed mobility comments: pt sitting in recliner on arrival    Transfers Overall transfer level: Needs assistance Equipment used: Rolling walker (2 wheels) Transfers: Sit to/from Stand Sit to Stand: Supervision           General transfer comment: supervision for safety    Ambulation/Gait Ambulation/Gait assistance: Supervision Gait Distance (Feet): 200 Feet Assistive device: Rolling walker (2 wheels) Gait Pattern/deviations: Step-to pattern, Antalgic, Decreased stance time - left Gait velocity: decr     General Gait Details: a couple cues provided for posture   Stairs             Wheelchair Mobility     Tilt Bed    Modified Rankin (Stroke Patients Only)       Balance                                            Communication Communication Communication: No apparent difficulties  Cognition Arousal: Alert Behavior During Therapy: WFL for tasks  assessed/performed   PT - Cognitive impairments: No apparent impairments                         Following commands: Intact      Cueing    Exercises Total Joint Exercises Ankle Circles/Pumps: AROM, Both, 10 reps Quad Sets: AROM, 10 reps, Both Heel Slides: AAROM, Supine, Left, 10 reps Hip ABduction/ADduction: AROM, AAROM, Supine, Standing, Left, 10 reps Long Arc Quad: AROM, Left, Seated, 10 reps Knee Flexion: AROM, Left, 10 reps, Standing Marching in Standing: AROM, Left, 10 reps, Standing Standing Hip Extension: AROM, Left, 10 reps, Standing    General Comments        Pertinent Vitals/Pain Pain Assessment Pain Assessment: 0-10 Pain Score: 5  Pain Location: left hip Pain Descriptors / Indicators: Sore, Aching Pain Intervention(s): Monitored during session, Repositioned    Home Living                          Prior Function            PT Goals (current goals can now be found in the care plan section) Progress towards PT goals: Progressing toward goals    Frequency    7X/week      PT Plan  Co-evaluation              AM-PAC PT 6 Clicks Mobility   Outcome Measure  Help needed turning from your back to your side while in a flat bed without using bedrails?: A Little Help needed moving from lying on your back to sitting on the side of a flat bed without using bedrails?: A Little Help needed moving to and from a bed to a chair (including a wheelchair)?: A Little Help needed standing up from a chair using your arms (e.g., wheelchair or bedside chair)?: A Little Help needed to walk in hospital room?: A Little Help needed climbing 3-5 steps with a railing? : A Little 6 Click Score: 18    End of Session Equipment Utilized During Treatment: Gait belt Activity Tolerance: Patient tolerated treatment well Patient left: in chair;with call bell/phone within reach;with family/visitor present   PT Visit Diagnosis: Difficulty in walking,  not elsewhere classified (R26.2)     Time: 8897-8871 PT Time Calculation (min) (ACUTE ONLY): 26 min  Charges:    $Gait Training: 8-22 mins $Therapeutic Exercise: 8-22 mins PT General Charges $$ ACUTE PT VISIT: 1 Visit                     Tari KLEIN, DPT Physical Therapist Acute Rehabilitation Services Office: [phone]    Tari CROME [place] 08/29/2024, 11:58 AM

## 2024-08-29 NOTE — Plan of Care (Signed)
" °  Problem: Education: Goal: Knowledge of the prescribed therapeutic regimen will improve Outcome: Progressing   Problem: Cardiac: Goal: Ability to maintain an adequate cardiac output Outcome: Progressing   Problem: Nutritional: Goal: Will attain and maintain optimal nutritional status Outcome: Progressing   Problem: Neurological: Goal: Will regain or maintain usual level of consciousness Outcome: Progressing   Problem: Education: Goal: Knowledge of General Education information will improve Description: Including pain rating scale, medication(s)/side effects and non-pharmacologic comfort measures Outcome: Progressing   "

## 2024-08-30 ENCOUNTER — Encounter (HOSPITAL_COMMUNITY): Payer: Self-pay | Admitting: Orthopaedic Surgery

## 2024-09-07 ENCOUNTER — Ambulatory Visit

## 2024-09-09 ENCOUNTER — Encounter: Admitting: Orthopaedic Surgery

## 2024-09-24 ENCOUNTER — Ambulatory Visit: Admitting: Family Medicine
